# Patient Record
Sex: Female | Born: 1985
Health system: Southern US, Community
[De-identification: ages and names within clinical notes are randomized; demographics above are authoritative.]

## PROBLEM LIST (undated history)

## (undated) DIAGNOSIS — I10 Essential (primary) hypertension: Secondary | ICD-10-CM

## (undated) DIAGNOSIS — I341 Nonrheumatic mitral (valve) prolapse: Secondary | ICD-10-CM

## (undated) DIAGNOSIS — E039 Hypothyroidism, unspecified: Secondary | ICD-10-CM

## (undated) DIAGNOSIS — L680 Hirsutism: Secondary | ICD-10-CM

## (undated) DIAGNOSIS — O139 Gestational [pregnancy-induced] hypertension without significant proteinuria, unspecified trimester: Secondary | ICD-10-CM

## (undated) DIAGNOSIS — I340 Nonrheumatic mitral (valve) insufficiency: Secondary | ICD-10-CM

## (undated) HISTORY — DX: Hirsutism: L68.0

## (undated) HISTORY — DX: Nonrheumatic mitral (valve) prolapse: I34.1

## (undated) HISTORY — DX: Hypothyroidism, unspecified: E03.9

## (undated) HISTORY — DX: Essential (primary) hypertension: I10

## (undated) HISTORY — PX: TOTAL THYROIDECTOMY: SHX2547

## (undated) HISTORY — PX: DILATION AND CURETTAGE OF UTERUS: SHX78

---

## 2007-12-11 ENCOUNTER — Inpatient Hospital Stay (HOSPITAL_COMMUNITY): Admission: AD | Admit: 2007-12-11 | Discharge: 2007-12-12 | Payer: Self-pay | Admitting: Obstetrics & Gynecology

## 2008-01-14 ENCOUNTER — Encounter: Admission: RE | Admit: 2008-01-14 | Discharge: 2008-01-14 | Payer: Self-pay | Admitting: *Deleted

## 2008-02-01 ENCOUNTER — Inpatient Hospital Stay (HOSPITAL_COMMUNITY): Admission: AD | Admit: 2008-02-01 | Discharge: 2008-02-02 | Payer: Self-pay | Admitting: Obstetrics

## 2008-06-19 ENCOUNTER — Inpatient Hospital Stay (HOSPITAL_COMMUNITY): Admission: AD | Admit: 2008-06-19 | Discharge: 2008-06-19 | Payer: Self-pay | Admitting: Obstetrics

## 2008-06-19 ENCOUNTER — Observation Stay (HOSPITAL_COMMUNITY): Admission: AD | Admit: 2008-06-19 | Discharge: 2008-06-19 | Payer: Self-pay | Admitting: *Deleted

## 2008-06-20 ENCOUNTER — Inpatient Hospital Stay (HOSPITAL_COMMUNITY): Admission: AD | Admit: 2008-06-20 | Discharge: 2008-06-23 | Payer: Self-pay | Admitting: *Deleted

## 2008-06-24 ENCOUNTER — Ambulatory Visit (HOSPITAL_COMMUNITY): Admission: RE | Admit: 2008-06-24 | Discharge: 2008-06-24 | Payer: Self-pay | Admitting: Obstetrics and Gynecology

## 2010-12-11 ENCOUNTER — Encounter: Payer: Self-pay | Admitting: *Deleted

## 2011-04-04 NOTE — Op Note (Signed)
NAME:  Samantha Meyer, Samantha Meyer             ACCOUNT NO.:  [id number]   MEDICAL RECORD NO.:  [id number]          PATIENT TYPE:  INP   LOCATION:  9130                          FACILITY:  [facility]   PHYSICIAN:  Lenoard Aden, M.D.DATE OF BIRTH:  [birth year]   DATE OF PROCEDURE:  06/20/2008  DATE OF DISCHARGE:                               OPERATIVE REPORT   PREOPERATIVE DIAGNOSES:  1. 37-week intrauterine pregnancy.  2. Gestational hypertension.  3. Failure to progress.   POSTOPERATIVE DIAGNOSES:  1. 37-week intrauterine pregnancy.  2. Gestational hypertension.  3. Failure to progress.   PROCEDURE:  Primary low transverse C-section.   SURGEON:  Chester Holstein. Earlene Plater, MD.   ASSISTANT:  None.   ANESTHESIA:  Epidural.   FINDINGS:  Viable female, Apgar's 8 and 9, weight pending from the newborn  nursery, and normal-appearing uterus, tubes, and ovaries.   SPECIMENS:  None.   BLOOD LOSS:  600   COMPLICATIONS:  None.   INDICATIONS:  The patient presented at 37+ weeks, prodromal labor, 4-5  cm dilated, contracting regularly, noted to have elevated blood  pressures, and normal preeclampsia labs.  Blood pressures were  substantially elevated over baseline, at times in the severe range.  Therefore, I recommended admission and augmentation with Pitocin if  necessary.   The patient subsequently had the above measures performed, but did not  progressed beyond 5-6 cm for several hours despite documented adequate  contractions by IUPC.  Suspicion for LGA fetus was also in mind;  therefore, I recommended primary cesarean section.  Risks of surgery was  discussed including infection, bleeding, and damage to the surrounding  organs.   DESCRIPTION OF PROCEDURE:  The patient was taken to the operating room  and epidural anesthesia obtained.  Prepped and draped in a standard  fashion.  Foley catheter was already in the bladder.  A low transverse  incision was made on the skin and carried sharply to the  fascia.  The  subcutaneous tissue was then divided bluntly and the fascia divided  bluntly.  Posterior sheath and peritoneum were entered bluntly  superiorly and the bladder blade was inserted.  Uterine incision was  made in low transverse fashion with a knife above the bladder  reflection.  This was extended laterally bluntly and clear fluid noted  at amniotomy.  Vertex elevated through the incision and delivered with  fundal pressure without difficulty.  Nuchal cord x1 reduced without  difficulty.  Shoulders were difficult to deliver due to the fetus' size;  therefore, the anterior shoulder, which was the left was delivered by  flexion at the elbow and traction at the wrist without difficulty.  Remainder of the infant subsequently delivered without difficulty.  Cord  clamped and cut.  Infant handed off to waiting NICU Team.  Ancef 1 g had  been given prior to skin incision.   Placenta was removed by uterine massage.  The uterus was exteriorized  and cleared of all clots and debris.  Uterine incision closed in a  running locked fashion with 0 chromic with hemostasis obtained.  Tubes  and ovaries appeared normal.  Uterus  returned to the abdomen, incision  reinspected, and was hemostatic.  The fascia was closed with a running  stitch of 0 Vicryl and subcutaneous tissue was closed with staples.   The patient tolerated the procedure well with no complications.  She was  taken to recovery room awake and alert in stable condition.  All counts  were correct per the operating room staff.      Lenoard Aden, M.D.  Electronically Signed     RJT/MEDQ  D:  06/20/2008  T:  06/21/2008  Job:  [id number]

## 2011-04-07 NOTE — Discharge Summary (Signed)
NAME:  MYKEL, MOHL             ACCOUNT NO.:  [id number]   MEDICAL RECORD NO.:  [id number]          PATIENT TYPE:  INP   LOCATION:  9130                          FACILITY:  [facility]   PHYSICIAN:  Lenoard Aden, M.D.DATE OF BIRTH:  [birth year]   DATE OF ADMISSION:  06/20/2008  DATE OF DISCHARGE:  06/23/2008                               DISCHARGE SUMMARY   The patient underwent complicated primary C-section for gestational  hypertension, failure to progress.  Postoperative course complicated by  shortness of breath with negative chest x-ray.  She is discharged to  home on hospital day #3.  Discharge teaching done.  Discharge  medications were Tylox, prenatal vitamins, and iron.  Follow up in the  office in 1 week for blood pressure check.  Discharge condition  satisfactory.      Lenoard Aden, M.D.  Electronically Signed     RJT/MEDQ  D:  07/22/2008  T:  07/23/2008  Job:  [id number]

## 2011-04-07 NOTE — Discharge Summary (Signed)
NAME:  Samantha Meyer, Samantha Meyer             ACCOUNT NO.:  [id number]   MEDICAL RECORD NO.:  [id number]          PATIENT TYPE:  INP   LOCATION:  9130                          [facility]ILITY:  [facility]   PHYSICIAN:  Lenoard Aden, M.D.DATE OF BIRTH:  [birth year]   DATE OF ADMISSION:  06/20/2008  DATE OF DISCHARGE:  06/23/2008                               DISCHARGE SUMMARY   Dictated in absence of Dr. Marina Gravel.  Please note that I was not  involved in the care of this patient.   The patient underwent uncomplicated, by report, primary C-section on  June 20, 2008.   POSTOPERATIVE COURSE:  Questionably complicated by an abrupt onset  shortness of breath with decreased oxygen saturations with an otherwise  normal CAT scan and chest x-ray.  The patient improved clinically and  was discharged to home on postop day #3.  Discharge teaching done.  Prenatal vitamins, iron, and Tylox were given.  Follow up in the office  in 1 week      Lenoard Aden, M.D.  Electronically Signed     Lenoard Aden, M.D.  Electronically Signed    RJT/MEDQ  D:  08/12/2008  T:  08/13/2008  Job:  [id number]

## 2011-08-11 LAB — URINALYSIS, ROUTINE W REFLEX MICROSCOPIC
Bilirubin Urine: NEGATIVE
Nitrite: NEGATIVE
Protein, ur: NEGATIVE
Specific Gravity, Urine: <1.005 — ABNORMAL LOW
Urobilinogen, UA: 0.2

## 2011-08-11 LAB — URINE CULTURE

## 2011-08-11 LAB — URINE MICROSCOPIC-ADD ON

## 2011-08-14 LAB — URINALYSIS, ROUTINE W REFLEX MICROSCOPIC
Bilirubin Urine: NEGATIVE
Ketones, ur: NEGATIVE
Nitrite: NEGATIVE
Protein, ur: NEGATIVE
Urobilinogen, UA: 0.2

## 2011-08-18 LAB — COMPREHENSIVE METABOLIC PANEL
ALT: 14
Albumin: 2.4 — ABNORMAL LOW
Alkaline Phosphatase: 122 — ABNORMAL HIGH
Alkaline Phosphatase: 112
BUN: 2 — ABNORMAL LOW
BUN: 2 — ABNORMAL LOW
Calcium: 8.6
Chloride: 107
GFR calc Af Amer: >60
Glucose, Bld: 130 — ABNORMAL HIGH
Glucose, Bld: 113 — ABNORMAL HIGH
Potassium: 3.5
Sodium: 135
Total Bilirubin: 1.1
Total Protein: 5.7 — ABNORMAL LOW

## 2011-08-18 LAB — URIC ACID
Uric Acid, Serum: 5.1
Uric Acid, Serum: 4.7

## 2011-08-18 LAB — CBC
HCT: 32.1 — ABNORMAL LOW
HCT: 32.5 — ABNORMAL LOW
Hemoglobin: 10.8 — ABNORMAL LOW
MCHC: 33.3
MCHC: 34.1
Platelets: 183
Platelets: 171
RBC: 3.12 — ABNORMAL LOW
RDW: 13.2
RDW: 13
RDW: 13.5

## 2011-08-18 LAB — RPR
RPR Ser Ql: NONREACTIVE
RPR Ser Ql: NONREACTIVE

## 2011-08-18 LAB — LACTATE DEHYDROGENASE: LDH: 191

## 2012-01-29 HISTORY — PX: MITRAL VALVE REPAIR: SHX2039

## 2012-02-06 ENCOUNTER — Other Ambulatory Visit: Payer: Self-pay

## 2012-02-06 ENCOUNTER — Encounter (HOSPITAL_COMMUNITY): Payer: Self-pay | Admitting: Emergency Medicine

## 2012-02-06 ENCOUNTER — Emergency Department (HOSPITAL_COMMUNITY): Payer: PRIVATE HEALTH INSURANCE

## 2012-02-06 ENCOUNTER — Emergency Department (HOSPITAL_COMMUNITY)
Admission: EM | Admit: 2012-02-06 | Discharge: 2012-02-06 | Disposition: A | Discharge status: Home / Self Care | Payer: PRIVATE HEALTH INSURANCE | Attending: Emergency Medicine | Admitting: Emergency Medicine

## 2012-02-06 DIAGNOSIS — R000 Tachycardia, unspecified: Secondary | ICD-10-CM

## 2012-02-06 DIAGNOSIS — R110 Nausea: Secondary | ICD-10-CM | POA: Insufficient documentation

## 2012-02-06 DIAGNOSIS — Z954 Presence of other heart-valve replacement: Secondary | ICD-10-CM | POA: Insufficient documentation

## 2012-02-06 DIAGNOSIS — R0989 Other specified symptoms and signs involving the circulatory and respiratory systems: Secondary | ICD-10-CM | POA: Insufficient documentation

## 2012-02-06 DIAGNOSIS — Z952 Presence of prosthetic heart valve: Secondary | ICD-10-CM

## 2012-02-06 DIAGNOSIS — Z7982 Long term (current) use of aspirin: Secondary | ICD-10-CM | POA: Insufficient documentation

## 2012-02-06 DIAGNOSIS — R0609 Other forms of dyspnea: Secondary | ICD-10-CM | POA: Insufficient documentation

## 2012-02-06 DIAGNOSIS — Z79899 Other long term (current) drug therapy: Secondary | ICD-10-CM | POA: Insufficient documentation

## 2012-02-06 DIAGNOSIS — R0602 Shortness of breath: Secondary | ICD-10-CM | POA: Insufficient documentation

## 2012-02-06 DIAGNOSIS — R5381 Other malaise: Secondary | ICD-10-CM | POA: Insufficient documentation

## 2012-02-06 DIAGNOSIS — R079 Chest pain, unspecified: Secondary | ICD-10-CM | POA: Insufficient documentation

## 2012-02-06 HISTORY — DX: Nonrheumatic mitral (valve) insufficiency: I34.0

## 2012-02-06 LAB — BASIC METABOLIC PANEL
CO2: 28 mEq/L (ref 19–32)
Calcium: 9.8 mg/dL (ref 8.4–10.5)
Chloride: 99 mEq/L (ref 96–112)
Creatinine, Ser: 0.91 mg/dL (ref 0.50–1.10)
Glucose, Bld: 104 mg/dL — ABNORMAL HIGH (ref 70–99)

## 2012-02-06 LAB — CBC
Hemoglobin: 11.6 g/dL — ABNORMAL LOW (ref 12.0–15.0)
MCH: 32.9 pg (ref 26.0–34.0)
MCV: 95.8 fL (ref 78.0–100.0)
RBC: 3.53 MIL/uL — ABNORMAL LOW (ref 3.87–5.11)
WBC: 14.1 10*3/uL — ABNORMAL HIGH (ref 4.0–10.5)

## 2012-02-06 LAB — POCT I-STAT TROPONIN I: Troponin i, poc: 0.03 ng/mL (ref 0.00–0.08)

## 2012-02-06 MED ORDER — OXYCODONE-ACETAMINOPHEN 5-325 MG PO TABS
1.0000 | ORAL_TABLET | Freq: Once | ORAL | Status: AC
Start: 2012-02-06 — End: 2012-02-06
  Administered 2012-02-06: 1 via ORAL
  Filled 2012-02-06: qty 1

## 2012-02-06 MED ORDER — IBUPROFEN 600 MG PO TABS: 600.0000 mg | ORAL_TABLET | Freq: Three times a day (TID) | ORAL | Status: DC

## 2012-02-06 MED ORDER — IBUPROFEN 200 MG PO TABS
600.0000 mg | ORAL_TABLET | Freq: Once | ORAL | Status: AC
  Administered 2012-02-06: 600 mg via ORAL
  Filled 2012-02-06: qty 3

## 2012-02-06 MED ORDER — IBUPROFEN 600 MG PO TABS: 600.0000 mg | ORAL_TABLET | Freq: Three times a day (TID) | ORAL | Status: AC

## 2012-02-06 NOTE — ED Notes (Signed)
Pt to xray

## 2012-02-06 NOTE — ED Provider Notes (Signed)
History     CSN: [id number]  Arrival date & time 02/06/12  1744   First MD Initiated Contact with Patient 02/06/12 1949      Chief Complaint  Patient presents with  . Palpitations    (Consider location/radiation/quality/duration/timing/severity/associated sxs/prior treatment) HPI Hx obtained from pt. 26yo black F, who is s/p recent minimally invasive mitral valve replacement performed at [facility] on 01/29/12, presents with rapid HR which started approx 2 hrs prior to arrival. She states that she was released from the hospital on Saturday and had an uneventful hospital course. She has been taking daily weights and vitals as instructed on d/c. Her weight has remained stable at 130lbs. However, she has noted that her BP has been slightly decreased from usual for the past 2 days at around 90s systolic where her normal is around 110 systolic. She noted this evening that she had an elevated HR of around 120. Denies any leg swelling or pain, fever, chills, cough. Has had intermittent chest pain, which is described as stabbing, since her surgery; she was told by her surgeon that this was to be expected and likely due to nerve irritation from the incisions and chest tube placement. Has had dyspnea which has not been changed. Had slight episode of nausea yesterday but none today.  Past Medical History  Diagnosis Date  . Mitral incompetence     Past Surgical History  Procedure Date  . Mitral valve repair 01/29/12    No family history on file.  History  Substance Use Topics  . Smoking status: Never Smoker   . Smokeless tobacco: Not on file  . Alcohol Use: No    OB History    Grav Para Term Preterm Abortions TAB SAB Ect Mult Living                  Review of Systems  Constitutional: Negative for fever, chills, activity change, appetite change and unexpected weight change.  HENT: Negative.   Eyes: Negative.   Respiratory: Positive for shortness of breath. Negative for chest tightness.     Cardiovascular: Positive for palpitations. Negative for chest pain and leg swelling.  Gastrointestinal: Positive for nausea. Negative for vomiting and abdominal pain.  Musculoskeletal: Negative for myalgias.  Skin: Negative for color change.  Neurological: Negative for dizziness, weakness and headaches.    Allergies  Review of patient's allergies indicates no known allergies.  Home Medications   Current Outpatient Rx  Name Route Sig Dispense Refill  . ACETAMINOPHEN 325 MG PO TABS Oral Take 650 mg by mouth every 6 (six) hours as needed. For pain    . AMIODARONE HCL 200 MG PO TABS Oral Take 200 mg by mouth daily.    . ASPIRIN EC 81 MG PO TBEC Oral Take 81 mg by mouth daily.    Marland Kitchen DOCUSATE SODIUM 100 MG PO CAPS Oral Take 100 mg by mouth 2 (two) times daily.    Marland Kitchen FERROUS GLUCONATE 324 (38 FE) MG PO TABS Oral Take 324 mg by mouth 2 (two) times daily.    . FUROSEMIDE 40 MG PO TABS Oral Take 40 mg by mouth daily.    . ADULT MULTIVITAMIN W/MINERALS CH Oral Take 1 tablet by mouth daily.    . OXYCODONE HCL 5 MG PO TABS Oral Take 5 mg by mouth every 6 (six) hours as needed. For pain    . POTASSIUM CHLORIDE CRYS ER 20 MEQ PO TBCR Oral Take 40 mEq by mouth daily.  BP 117/65  Pulse 115  Temp(Src) 98.7 F (37.1 C) (Oral)  Resp 20  SpO2 100%  LMP 02/04/2012  Physical Exam  Nursing note and vitals reviewed. Constitutional: She appears well-developed and well-nourished. No distress.  HENT:  Head: Normocephalic and atraumatic.  Mouth/Throat: Oropharynx is clear and moist.  Eyes: EOM are normal. Pupils are equal, round, and reactive to light.  Neck: Normal range of motion.  Cardiovascular: Regular rhythm and normal heart sounds.  Tachycardia present.   Pulmonary/Chest: Effort normal and breath sounds normal. No respiratory distress. She has no wheezes. She exhibits no tenderness.  Abdominal: Soft. Bowel sounds are normal. There is no tenderness. There is no rebound and no guarding.   Musculoskeletal:       No LE edema noted, moves all 4 ext  Neurological: She is alert.  Skin: Skin is warm and dry. She is not diaphoretic.  Psychiatric: She has a normal mood and affect.    ED Course  Procedures (including critical care time)  Labs Reviewed  CBC - Abnormal; Notable for the following:    WBC 14.1 (*)    RBC 3.53 (*)    Hemoglobin 11.6 (*)    HCT 33.8 (*)    Platelets 468 (*)    All other components within normal limits  BASIC METABOLIC PANEL - Abnormal; Notable for the following:    Glucose, Bld 104 (*)    GFR calc non Af Amer 86 (*)    All other components within normal limits  POCT I-STAT TROPONIN I  PREGNANCY, URINE   Dg Chest 2 View  02/06/2012  *RADIOLOGY REPORT*  Clinical Data: Shortness of breath, palpitations, dyspnea, weakness, post mitral valve repair  CHEST - 2 VIEW  Comparison: None.  Findings: Borderline enlargement cardiac silhouette post MVR. Minimal pulmonary vascular congestion. Mediastinal contours normal. Right pleural effusion with mild bibasilar atelectasis. Upper lungs clear. No acute infiltrate or pneumothorax. Bones unremarkable.  IMPRESSION: Borderline enlargement of cardiac silhouette post MVR. Right pleural effusion with mild bibasilar atelectasis.  Original Report Authenticated By: Lollie Marrow, M.D.   I personally reviewed the plain films.   1. S/P mitral valve replacement   2. Tachycardia       MDM  9:30 PM Discussed pt care with Dr. Ignacia Palma. Labs thus far unremarkable; negative troponin; small R pleural effusion which is likely 2/2 surgery. Tachycardic to ~110-120. Plan to consult [facility] for recs re: need for CTA chest.  10:05 PM Dr. Ignacia Palma discussed with provider on call at [facility]. Did not feel that CTA necessary. Recommended starting the pt on 600mg  Motrin TID in addition to other pain meds, and have the pt call their office directly with further concerns. Return precautions given.  Medical screening  examination/treatment/procedure(s) were conducted as a shared visit with non-physician practitioner(s) and myself.  I personally evaluated the patient during the encounter Pt is 26 year old woman who had recent heart surgery at [facility] for mitral valve replacement.  Exam shows her to have pain on change of position.  Lungs clear, heart sounds normal.  WBC mildly elevated, EKG negative, CXR shows mild R pleural effusion.  Discussed with [facility] cardiovascular surgeon --> diff Dx includes incisional pain, mild pericarditis, doubt PE.  He advised ibuprofen 600 mg tid.  She has an appointment on March 28th at [facility]; she can call in to be seen sooner if she has worsening symptoms. Osvaldo Human, M.D.      Grant Fontana, [place] 02/06/12 2207  Carleene Cooper  III, MD 02/07/12 1259

## 2012-02-06 NOTE — ED Notes (Signed)
Pt c/o rapid heart rate onset approx 2 hrs ago.  Denies any chest pain.  Had mitral valve repain on 3/11 at [facility].  Pt also st's she felt her heart beating irregular

## 2012-02-06 NOTE — ED Provider Notes (Signed)
9:30 PM  Date: 02/06/2012  Rate: 119  Rhythm: sinus tachycardia  QRS Axis: normal  Intervals: PR prolonged  ST/T Wave abnormalities: nonspecific T wave changes  Conduction Disutrbances:none  Narrative Interpretation: Abnormal EKG  Old EKG Reviewed: none available    Carleene Cooper III, MD 02/06/12 2131

## 2012-02-06 NOTE — Discharge Instructions (Signed)
We talked to your providers at [facility] who recommend: 1) start taking the ibuprofen on top of your normal pain medication 2) call them directly at the number listed above if needed for worsening symptoms 3) for worsening condition or other worrisome symptoms, please return to the ED.  Mitral Valve Replacement Care After  Refer to this sheet in the next few weeks. These instructions provide you with information on caring for yourself after your procedure. Your caregiver may also give you specific instructions. Your treatment has been planned according to current medical practices, but problems sometimes occur. Call your caregiver if you have any problems or questions after your procedure.  HOME CARE INSTRUCTIONS   Only take over-the-counter or prescription medicines for pain, fever, or discomfort as directed by your caregiver.   Take your temperature every morning for the first week after surgery. Write these down.   Weigh yourself every morning for at least the first week after surgery and record.   Do not lift more than 10 lb (4.5 kg) until your breastbone (sternum) has healed. Avoid all activities which would place strain on your surgical cut (incision).   You may shower. Do not take baths until instructed by your surgeon. Pat incisions dry. Do not rub incisions with washcloth or towel.   Avoid driving for 4 to 6 weeks after surgery or as instructed.   Use your elastic stockings during the day. You should wear the stockings for at least 2 weeks after discharge or longer if your ankles are swollen. The stockings help blood flow and help reduce swelling in the legs. It is easiest to put the stockings on before you get out of bed in the morning. They should be snug.   Some individuals with a mitral valve replacement need to take antibiotics before having dental work or other surgical procedures. This is called prophylactic antibiotic treatment. These drugs help to prevent infective endocarditis.  Antibiotics are only recommended for individuals with the highest risk for developing infective endocarditis. Let your dentist and your caregiver know if you have a history of any of the following so that the necessary precautions can be taken:   A VSD.   A repaired VSD.   Endocarditis in the past.   An artificial (prosthetic) heart valve.  Pain Control  You may feel some chest, shoulder, or abdominal discomfort. It is caused by the absorption of air through the chest wall.   If a prescription was given for a pain reliever, follow your surgeon's directions.   If the pain is not relieved by your medicine, becomes worse, or you have difficulty breathing, call your surgeon.  Activity  Take frequent rest periods throughout the day.   Wait 1 week before returning to strenuous activities such as heavy lifting (more than 10 pounds), pushing, or pulling.   Talk with your doctor about when you may return to work and your exercise routine.   Do not drive while taking prescription pain medication.  Nutrition  You may resume your normal diet as instructed.   Eat a well-balanced diet.  Elimination Your normal bowel function should return. If constipation should occur, you may:  Take a mild laxative as directed by your caregiver.   Add fruit and bran to your diet.   Drink enough fluids to keep your urine clear or pale yellow.   Call your surgeon if constipation is not relieved.  SEEK IMMEDIATE MEDICAL CARE IF:   You develop chest pain which is not coming from  your incision.   You develop shortness of breath or have difficulty breathing.   You develop a temperature over 102 F (38.9 C).   You have a sudden weight gain. Let your doctor know what the weight gain is.   You develop a rash.   You develop any reaction or side effects to medications given.   You have increased bleeding from wounds.   You see redness, swelling, or have increasing pain in wounds.   You have  yellowish white fluid (pus) coming from your wound.   You feel lightheaded or feel faint.   You feel sick to your stomach (nauseous) or throw up (vomit).  Document Released: 05/26/2005 Document Revised: 10/26/2011 Document Reviewed: 11/06/2005 [facility] Patient Information 2012 [place], [place].Tachycardia, Nonspecific In adults, the heart normally beats between 60 and 100 times a minute. A heart rate over 100 is called tachycardia. When your heart beats too fast, it may not be able to pump enough blood to the rest of the body. CAUSES   Exercise or exertion.   Fever.   Pain or injury.   Infection.   Loss of fluid (dehydration).   Overactive thyroid.   Lack of red blood cells (anemia).   Anxiety.   Alcohol.   Heart arrhythmia.   Caffeine.   Tobacco products.   Diet pills.   Street drugs.   Heart disease.  SYMPTOMS  Palpitations (rapid or irregular heartbeat).   Dizziness.   Tiredness (fatigue).   Shortness of breath.  DIAGNOSIS  After an exam and taking a history, your caregiver may order:  Blood tests.   Electrocardiogram (EKG).   Heart monitor.  TREATMENT  Treatment will depend on the cause and potential for harm. It may include:  Intravenous (IV) replacement of fluids or blood.   Antidote or reversal medicines.   Changes in your present medicines.   Lifestyle changes.  HOME CARE INSTRUCTIONS   Get rest.   Drink enough water and fluids to keep your urine clear or pale yellow.   Avoid:   Caffeine.   Nicotine.   Alcohol.   Stress.   Chocolate.   Stimulants.   Only take medicine as directed by your caregiver.  SEEK IMMEDIATE MEDICAL CARE IF:   You have pain in your chest, upper arms, jaw, or neck.   You become weak, dizzy, or feel faint.   You have palpitations that will not go away.   You throw up (vomit), have diarrhea, or pass blood.   You look pale and your skin is cool and wet.  MAKE SURE YOU:   Understand these  instructions.   Will watch your condition.   Will get help right away if you are not doing well or get worse.  Document Released: 12/14/2004 Document Revised: 10/26/2011 Document Reviewed: 10/17/2011 [facility] Patient Information 2012 [place], [place].

## 2012-02-06 NOTE — ED Notes (Signed)
Rx x 1, pt voiced understanding to f/u with MD at [facility] for worsening of condition.

## 2012-02-06 NOTE — ED Notes (Signed)
Pt from x-ray in w/c

## 2012-02-06 NOTE — ED Notes (Signed)
[facility] contacted, PA on call for Dr. Silvestre Mesi , Rochele Pages will be returning call to Dr. Ignacia Palma.

## 2015-05-26 DIAGNOSIS — I471 Supraventricular tachycardia, unspecified: Secondary | ICD-10-CM | POA: Insufficient documentation

## 2015-05-26 DIAGNOSIS — R55 Syncope and collapse: Secondary | ICD-10-CM | POA: Insufficient documentation

## 2015-05-26 DIAGNOSIS — I059 Rheumatic mitral valve disease, unspecified: Secondary | ICD-10-CM | POA: Insufficient documentation

## 2017-09-05 ENCOUNTER — Encounter: Payer: Self-pay | Admitting: Internal Medicine

## 2017-11-09 ENCOUNTER — Encounter: Payer: Self-pay | Admitting: Internal Medicine

## 2017-11-09 ENCOUNTER — Ambulatory Visit: Payer: BC Managed Care – PPO | Admitting: Internal Medicine

## 2017-11-09 VITALS — BP 130/76 | HR 75 | Ht 67.5 in | Wt 175.2 lb

## 2017-11-09 DIAGNOSIS — E890 Postprocedural hypothyroidism: Secondary | ICD-10-CM | POA: Diagnosis not present

## 2017-11-09 LAB — T4, FREE: Free T4: 0.49 ng/dL — ABNORMAL LOW (ref 0.60–1.60)

## 2017-11-09 LAB — TSH: TSH: 33.11 u[IU]/mL — ABNORMAL HIGH (ref 0.35–4.50)

## 2017-11-09 NOTE — Progress Notes (Signed)
Patient ID: Samantha RevereCandice E Meyer, female   DOB: 06-12-1986, 31 y.o.   MRN: [id number]    HPI  Samantha Meyer is a 31 y.o.-year-old female, referred by Dr. Billy Coastaavon for management of uncontrolled postsurgical hypothyroidism.  Pt. has a history of MVP (dx at 31 y/o) >> repair surgery in 11/2011. She continued to have anxiety, hot flushes, palpitations, dizziness, weight loss, after the sx. 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   She tried oral medications for hyperthyroidism for 2-3 years >> but developed SVT: tachycardia + syncope >> as she had a large goiter then, she was advised to have total thyroidectomy >> had this in 2016.  After her total thyroidectomy >> started on Levothyroxine but she was noncompliant and her endocrinologist left practice >> TFTs fluctuating but mostly very high (see below) >> Dr. Billy Coastaavon referred her to me.   She was on Levothyroxine 137 mcg - stopped taking it in 08/2017 after the last set of TFTs! She was taking this: - fasting, 1h after waking up in am - with water - separated by <30 min from b'fast  - no calcium, PPIs - + multivitamins in am - no iron  I reviewed pt's thyroid tests: 08/31/2017: TSH 19.8, Free T3 2.4 (2-4.4): Free T4 1.41 (0.82-1.77) 06/05/2017: TSH 75 No results found for: TSH, FREET4   Pt describes: - + weight gain - ~50 lbs after thyroid sx. - + fatigue - no cold intolerance - + depression - no constipation - + dry skin - + hair loss - + Lighter menses, but monthly.  Pt denies feeling nodules in neck, hoarseness, dysphagia/odynophagia, SOB with lying down.  She has + FH of thyroid disorders in: father - hypothyroidism. No FH of thyroid cancer.  No h/o radiation tx to head or neck. No recent use of iodine supplements.  ROS: Constitutional: + see HPI Eyes: no blurry vision, no xerophthalmia ENT: no sore throat, + see HPI Cardiovascular: + CP (less frequent than before) /SOB/palpitations/leg swelling Respiratory: no  cough/SOB Gastrointestinal: no N/V/D/C Musculoskeletal: no muscle/joint aches Skin: no rashes Neurological: no tremors/numbness/tingling/dizziness Psychiatric: no depression/anxiety  Past Medical History:  Diagnosis Date  . Mitral incompetence    Past Surgical History:  Procedure Laterality Date  . MITRAL VALVE REPAIR  01/29/12   Social History   Socioeconomic History  . Marital status: Married    Spouse name: Not on file  . Number of children: 1 boy - 989 y/o in 2018  Social Needs  Occupational History  . counselor  Tobacco Use  . Smoking status: Never Smoker  . Smokeless tobacco: Never Used  Substance and Sexual Activity  . Alcohol use: No  . Drug use: No  . Sexual activity: Yes    Birth control/protection: IUD   Current Outpatient Medications on File Prior to Visit  Medication Sig Dispense Refill  . Multiple Vitamin (MULITIVITAMIN WITH MINERALS) TABS Take 1 tablet by mouth daily.    .       No current facility-administered medications on file prior to visit.    No Known Allergies   FHL: see HPI  PE: BP 130/76   Pulse 75   Ht 5' 7.5" (1.715 m)   Wt 175 lb 3.2 oz (79.5 kg)   LMP 11/04/2017   SpO2 98%   BMI 27.04 kg/m  Wt Readings from Last 3 Encounters:  11/09/17 175 lb 3.2 oz (79.5 kg)   Constitutional: overweight, in NAD Eyes: PERRLA, EOMI, no exophthalmos ENT: moist mucous membranes,  no neck masses, thyroidectomy scar healed, w/o cheloid, no cervical lymphadenopathy Cardiovascular: RRR, No MRG Respiratory: CTA B Gastrointestinal: abdomen soft, NT, ND, BS+ Musculoskeletal: no deformities, strength intact in all 4 Skin: moist, warm, no rashes Neurological: no tremor with outstretched hands, DTR normal in all 4  ASSESSMENT: 1.  Postsurgical hypothyroidism  PLAN:  1. Patient with long-standing, uncontrolled, hypothyroidism, not compliant with on levothyroxine therapy.Reviewed prev. TSH levels >> she had a TSH of 75 this summer (was off LT4 then,  also). She then started to take the med but skipping doses and taking it right before b'fast >> TSH improved to 19. After this, as she was still tired and still gaining weight >> she stopped completely. We discussed at this visit about potential short and long term consequences of uncontrolled hypothyroidism, to include sudden cardiac death, CHF and dementia. She is now determined to gain control of her thyroid condition. - for now, will restart LT4 137 mcg daily - We discussed about correct intake of levothyroxine, fasting, with water, separated by at least 30 minutes from breakfast, and separated by more than 4 hours from calcium, iron, multivitamins, acid reflux medications (PPIs). - will check thyroid tests today: TSH, free T4 - we will also repeat labs in 5 weeks after restarting LT4  - she has scheduled wisdom teeth extraction on 11/29/2017 >> we discussed that she is a high risk for anesthesia if she has uncontrolled hypothyroidism >> she will try to postpone the Sx - I will see her back in 3 months  Office Visit on 11/09/2017  Component Date Value Ref Range Status  . TSH 11/09/2017 33.11* 0.35 - 4.50 uIU/mL Final  . Free T4 11/09/2017 0.49* 0.60 - 1.60 ng/dL Final   Comment: Specimens from patients who are undergoing biotin therapy and /or ingesting biotin supplements may contain high levels of biotin.  The higher biotin concentration in these specimens interferes with this Free T4 assay.  Specimens that contain high levels  of biotin may cause false high results for this Free T4 assay.  Please interpret results in light of the total clinical presentation of the patient.     Will continue with the plan to restart her LT4 137 mcg daily.  Carlus Pavlovristina Isela Stantz, MD PhD [facility]

## 2017-11-09 NOTE — Patient Instructions (Signed)
Please stop at the lab.  Please restart levothyroxine 137 mcg daily.  Take the thyroid hormone every day, with water, at least 30 minutes before breakfast, separated by at least 4 hours from: - acid reflux medications - calcium - iron - multivitamins  Come back for labs in 5 weeks.  Please come back for a follow-up appointment in 3 months.   Hypothyroidism Hypothyroidism is a disorder of the thyroid. The thyroid is a large gland that is located in the lower front of the neck. The thyroid releases hormones that control how the body works. With hypothyroidism, the thyroid does not make enough of these hormones. What are the causes? Causes of hypothyroidism may include:  Viral infections.  Pregnancy.  Your own defense system (immune system) attacking your thyroid.  Certain medicines.  Birth defects.  Past radiation treatments to your head or neck.  Past treatment with radioactive iodine.  Past surgical removal of part or all of your thyroid.  Problems with the gland that is located in the center of your brain (pituitary).  What are the signs or symptoms? Signs and symptoms of hypothyroidism may include:  Feeling as though you have no energy (lethargy).  Inability to tolerate cold.  Weight gain that is not explained by a change in diet or exercise habits.  Dry skin.  Coarse hair.  Menstrual irregularity.  Slowing of thought processes.  Constipation.  Sadness or depression.  How is this diagnosed? Your health care provider may diagnose hypothyroidism with blood tests and ultrasound tests. How is this treated? Hypothyroidism is treated with medicine that replaces the hormones that your body does not make. After you begin treatment, it may take several weeks for symptoms to go away. Follow these instructions at home:  Take medicines only as directed by your health care provider.  If you start taking any new medicines, tell your health care provider.  Keep  all follow-up visits as directed by your health care provider. This is important. As your condition improves, your dosage needs may change. You will need to have blood tests regularly so that your health care provider can watch your condition. Contact a health care provider if:  Your symptoms do not get better with treatment.  You are taking thyroid replacement medicine and: ? You sweat excessively. ? You have tremors. ? You feel anxious. ? You lose weight rapidly. ? You cannot tolerate heat. ? You have emotional swings. ? You have diarrhea. ? You feel weak. Get help right away if:  You develop chest pain.  You develop an irregular heartbeat.  You develop a rapid heartbeat. This information is not intended to replace advice given to you by your health care provider. Make sure you discuss any questions you have with your health care provider. Document Released: 11/06/2005 Document Revised: 04/13/2016 Document Reviewed: 03/24/2014 Elsevier Interactive Patient Education  2018 [employer]

## 2017-11-12 ENCOUNTER — Telehealth: Payer: Self-pay

## 2017-11-12 NOTE — Telephone Encounter (Signed)
VM full unable to LVM

## 2017-11-12 NOTE — Telephone Encounter (Signed)
-----   Message from Carlus Pavlovristina Gherghe, MD sent at 11/09/2017  5:28 PM EST ----- Toni Amendourtney, can you please call pt: Please give her the TSH result (3) >> continue with the plan to restart her LT4 and come back for labs in 5 weeks, as discussed.

## 2017-12-04 ENCOUNTER — Telehealth: Payer: Self-pay | Admitting: Internal Medicine

## 2017-12-04 MED ORDER — LEVOTHYROXINE SODIUM 112 MCG PO TABS: 112.0000 ug | ORAL_TABLET | Freq: Every day | ORAL | 0 refills | Status: DC

## 2017-12-04 NOTE — Telephone Encounter (Signed)
Pt has lab visit on 12/17/17 should she cancel that appointment and move it out 5 more weeks?

## 2017-12-04 NOTE — Telephone Encounter (Signed)
Please advise on below and if it can be connected to her new dosage

## 2017-12-04 NOTE — Telephone Encounter (Signed)
Please try to send a lower dose of levothyroxine for her, 112.  We need another set of TFTs at least 5 weeks after this change in dose.

## 2017-12-04 NOTE — Telephone Encounter (Signed)
Yes, lets do that!

## 2017-12-04 NOTE — Telephone Encounter (Signed)
Pt called about having headaches 3 weeks feeling dizzy and for 1 week. Pt noticed a difference since taking medication of  levothyroxine (SYNTHROID, LEVOTHROID) 137 MCG tablet. Please advise? Thank you!  Call pt @ [phone].

## 2017-12-05 NOTE — Telephone Encounter (Signed)
Pt aware and appt moved

## 2017-12-12 NOTE — Telephone Encounter (Signed)
Sue LushAndrea from Dr Billy Coastaavon office need last office notes for this patient  Attention Sue Lushndrea Fax# [phone]

## 2017-12-14 NOTE — Telephone Encounter (Signed)
Last office visit notes from 11/09/2017 faxed to Dr. Jorene Minorsaavon's office.

## 2017-12-17 ENCOUNTER — Other Ambulatory Visit: Payer: PRIVATE HEALTH INSURANCE

## 2018-01-18 ENCOUNTER — Other Ambulatory Visit: Payer: PRIVATE HEALTH INSURANCE

## 2018-01-18 DIAGNOSIS — E890 Postprocedural hypothyroidism: Secondary | ICD-10-CM

## 2018-01-18 LAB — T4, FREE: FREE T4: 2 ng/dL — AB (ref 0.8–1.8)

## 2018-01-18 LAB — TSH: TSH: 0.02 m[IU]/L — AB

## 2018-01-18 NOTE — Addendum Note (Signed)
Addended by: STONE-ELMORE, Valinda Fedie I on: 01/18/2018 04:35 PM   Modules accepted: Orders  

## 2018-01-18 NOTE — Addendum Note (Signed)
Addended by: Adline MangoSTONE-ELMORE, Hava Massingale I on: 01/18/2018 04:35 PM   Modules accepted: Orders

## 2018-01-18 NOTE — Addendum Note (Signed)
Addended by: STONE-ELMORE, Joniyah Mallinger I on: 01/18/2018 04:35 PM   Modules accepted: Orders  

## 2018-01-21 ENCOUNTER — Telehealth: Payer: Self-pay

## 2018-01-21 DIAGNOSIS — E890 Postprocedural hypothyroidism: Secondary | ICD-10-CM

## 2018-01-21 MED ORDER — LEVOTHYROXINE SODIUM 100 MCG PO TABS: 100.0000 ug | ORAL_TABLET | Freq: Every day | ORAL | 3 refills | Status: DC

## 2018-01-21 NOTE — Telephone Encounter (Signed)
-----   Message from Carlus Pavlovristina Gherghe, MD sent at 01/21/2018  9:29 AM EST ----- Toni Amendourtney, can you please call pt: The levothyroxine dose is still slightly high, at 112 mcg daily.  Can you please call in 100 mcg levothyroxine and have her back for another set of labs: TSH, free T4 (can you please order?)  In 1.5 months.  Please take off the 112 mcg dose from her medication list.

## 2018-01-22 ENCOUNTER — Telehealth: Payer: Self-pay | Admitting: Internal Medicine

## 2018-01-23 NOTE — Telephone Encounter (Signed)
Please advise 

## 2018-01-23 NOTE — Telephone Encounter (Signed)
Patient called and wanted to know if she can have her labs redone since there was a big difference in her labs. Please give her a call back to discuss with her.

## 2018-01-24 NOTE — Telephone Encounter (Signed)
Called patient and scheduled lab appt

## 2018-01-24 NOTE — Telephone Encounter (Signed)
OK 

## 2018-01-24 NOTE — Telephone Encounter (Signed)
C, please give her a call and see what she means.  We have an appointment later this month and we can repeat her labs then if absolutely needed, otherwise, I usually like to wait at least 5 weeks between labs.

## 2018-01-24 NOTE — Telephone Encounter (Addendum)
Spoke to patient. She says she does not trust that her lab values were right b/c of the drastic change. Does not want to wait at least 5 weeks between labs, after I explained Dr. Charlean SanfilippoGherghe's previous note and the benefits of doing so. Patient insist labs ordered to be done asap.

## 2018-01-29 ENCOUNTER — Other Ambulatory Visit (INDEPENDENT_AMBULATORY_CARE_PROVIDER_SITE_OTHER): Payer: BC Managed Care – PPO

## 2018-01-29 DIAGNOSIS — E890 Postprocedural hypothyroidism: Secondary | ICD-10-CM

## 2018-01-29 LAB — TSH: TSH: 0.02 u[IU]/mL — ABNORMAL LOW (ref 0.35–4.50)

## 2018-01-30 ENCOUNTER — Telehealth: Payer: Self-pay

## 2018-01-30 ENCOUNTER — Telehealth: Payer: Self-pay | Admitting: Internal Medicine

## 2018-01-30 LAB — T4, FREE: FREE T4: 1.24 ng/dL (ref 0.60–1.60)

## 2018-01-30 NOTE — Telephone Encounter (Signed)
Spoke to patient. Gave lab results and instructions. Patient verbalized understanding and says she will discuss repeating labs in OV on 03/22.

## 2018-01-30 NOTE — Telephone Encounter (Signed)
Patient returned 2 missed calls from our office re: lab results. Please call pt at ph# [phone]. If no answer leave detailed message on above ph#

## 2018-01-30 NOTE — Telephone Encounter (Signed)
-----   Message from Carlus Pavlovristina Gherghe, MD sent at 01/30/2018 12:28 PM EDT ----- Tad Mooreasandra, can you please call pt: Samantha Meyer TFTs were drawn too soon after the previous (only 12 days when it should have been at least 5 weeks).  Can you please reorder a TSH and free T4 and have Samantha Meyer come back in another 4 weeks?

## 2018-01-30 NOTE — Telephone Encounter (Signed)
Called patient. No answer. Will try later.  

## 2018-02-08 ENCOUNTER — Ambulatory Visit (INDEPENDENT_AMBULATORY_CARE_PROVIDER_SITE_OTHER): Payer: BC Managed Care – PPO | Admitting: Internal Medicine

## 2018-02-08 ENCOUNTER — Encounter: Payer: Self-pay | Admitting: Internal Medicine

## 2018-02-08 VITALS — BP 126/82 | HR 90 | Ht 67.5 in | Wt 170.0 lb

## 2018-02-08 DIAGNOSIS — E890 Postprocedural hypothyroidism: Secondary | ICD-10-CM

## 2018-02-08 NOTE — Progress Notes (Signed)
Patient ID: Samantha Meyer, female   DOB: [birth year], 32 y.o.   MRN: [id number]    HPI  Samantha Meyer is a 32 y.o.-year-old female, initially referred by Dr. Billy Coast, returning for follow-up for t of uncontrolled postsurgical hypothyroidism.  Last visit 3 months ago.  Reviewed history: Pt. has a history of MVP (dx at 32 y/o) >> repair surgery in 11/2011. She continued to have anxiety, hot flushes, palpitations, dizziness, weight loss, after the sx. 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   She tried oral medications for hyperthyroidism for 2-3 years >> but developed SVT: tachycardia + syncope >> as she had a large goiter then, she was advised to have total thyroidectomy >> had this in 2016.  After her total thyroidectomy >> started on Levothyroxine but she was noncompliant and her endocrinologist left practice >> TFTs fluctuating but mostly very high (see below) >> Dr. Billy Coast referred her to me.   At last visit, we had a long discussion about possible consequences of uncontrolled hypothyroidism and I recommended that she restarted her levothyroxine (at that time, she was off the medication completely).  We restarted it at 137 mcg daily but we had to decrease the dose to 100 mcg daily after she started to take it consistently.  Pt is on levothyroxine 100 mcg daily, taken: - in am; at 6-6:30 am - fasting - at least 30 min from b'fast - no Ca, Fe, PPIs - + MVI  - at night - not on Biotin  I reviewed patient's TFTs: Lab Results  Component Value Date   TSH 0.02 (L) 01/29/2018   TSH 0.02 (L) 01/18/2018   TSH 33.11 (H) 11/09/2017   FREET4 1.24 01/29/2018   FREET4 2.0 (H) 01/18/2018   FREET4 0.49 (L) 11/09/2017  08/31/2017: TSH 19.8, Free T3 2.4 (2-4.4): Free T4 1.41 (0.82-1.77) 06/05/2017: TSH 75  Pt denies: - feeling nodules in neck - hoarseness - dysphagia - choking - SOB with lying down  She has + FH of thyroid disorders in: father - hypothyroidism. No FH of thyroid cancer.  No h/o radiation tx to head or neck.  No seaweed or kelp. No recent contrast studies. No herbal supplements. No Biotin use. No recent steroids use.   She stopped drinking sugary drinks, fast foods, fried foods.  ROS: Constitutional: + intentional o weight loss, + fatigue, no subjective hyperthermia, no subjective hypothermia Eyes: no blurry vision, no xerophthalmia ENT: no sore throat, + see HPI Cardiovascular: no CP/no SOB/+ occas. palpitations/no leg swelling Respiratory: no cough/no SOB/no wheezing Gastrointestinal: no N/no V/no D/no C/no acid reflux Musculoskeletal: no muscle aches/no joint aches Skin: no rashes, no hair loss Neurological: no tremors/no numbness/no tingling/no dizziness  I reviewed pt's medications, allergies, PMH, social hx, family hx, and changes were documented in the history of present illness. Otherwise, unchanged from my initial visit note.  Past Medical History:  Diagnosis Date  . Mitral incompetence    Past Surgical History:  Procedure Laterality Date  . MITRAL VALVE REPAIR  01/29/12   Social History   Socioeconomic History  . Marital status: Married    Spouse name: Not on file  . Number of children: 1 boy - 27 y/o in 2018  Social Needs  Occupational History  . counselor  Tobacco Use  . Smoking status: Never Smoker  . Smokeless tobacco: Never Used  Substance and Sexual Activity  . Alcohol use: No  . Drug use: No  . Sexual activity: Yes    Birth  control/protection: IUD   Current Outpatient Medications  Medication Sig Dispense Refill  . levothyroxine (SYNTHROID, LEVOTHROID) 100 MCG tablet Take 1 tablet (100 mcg total) by mouth daily. 90 tablet 3  . Multiple Vitamin (MULITIVITAMIN WITH MINERALS) TABS Take 1 tablet by mouth daily.     No current facility-administered medications for this visit.     No Known Allergies   FHL: see HPI  PE: BP 126/82   Pulse 90   Ht 5' 7.5" (1.715 m)   Wt 170 lb (77.1 kg)   SpO2 99%   BMI 26.23 kg/m   Wt Readings from Last 3 Encounters:  02/08/18 170 lb (77.1 kg)  11/09/17 175 lb 3.2 oz (79.5 kg)   Constitutional: overweight, in NAD Eyes: PERRLA, EOMI, no exophthalmos ENT: moist mucous membranes, no neck masses, thyroidectomy scar healed, no cervical lymphadenopathy Cardiovascular: RRR, No MRG Respiratory: CTA B Gastrointestinal: abdomen soft, NT, ND, BS+ Musculoskeletal: no deformities, strength intact in all 4 Skin: moist, warm, no rashes Neurological: no tremor with outstretched hands, DTR normal in all 4  ASSESSMENT: 1.  Postsurgical hypothyroidism  PLAN:  1. Patient with long-standing, uncontrolled, hypothyroidism, previously noncompliant with her levothyroxine therapy.  In summer 2018 she had a TSH of 75 (off levothyroxine).  She then started to take the medication but skipped doses and was taking it right before breakfast so her TSH improved to 19.  As she became more tired, she stopped taking it completely afterwards.  At previous visits we discussed about potential short-term and long-term consequences of uncontrolled hypothyroidism to include sudden death, CHF, dementia.  At last visit I advised her to start taking levothyroxine 137 mcg consistently. - She did start to take this-as prescribed but she developed HAs >> decreased the dose to 112 mcg daily. The next TSH was suppressed so we ended up decreasing her levothyroxine dose to 100 mcg daily 4 weeks ago. - latest thyroid labs reviewed with pt >> TSH was suppressed, after which we decreased the dose - pt feels good on this current dose - we discussed about taking the thyroid hormone every day, with water, >30 minutes before breakfast, separated by >4 hours from acid reflux medications, calcium, iron, multivitamins. Pt. is taking it correctly. - will check thyroid tests in 2 more  weeks: TSH and fT4 - RTC in 6 mo  - time spent with the patient: 15 min, of which >50% was spent in obtaining information about her symptoms,  reviewing her previous labs, evaluations, and treatments, counseling her about her condition (please see the discussed topics above), and developing a plan to further investigate and treat it; she had a number of questions which I addressed.   Carlus Pavlovristina Mackinley Kiehn, MD PhD [facility]

## 2018-02-08 NOTE — Patient Instructions (Signed)
Please come back for labs in 2 weeks.  Continue Levothyroxine 100 mcg daily.  Take the thyroid hormone every day, with water, at least 30 minutes before breakfast, separated by at least 4 hours from: - acid reflux medications - calcium - iron - multivitamins  Please come back for a follow-up appointment in 6 months.

## 2018-02-22 ENCOUNTER — Other Ambulatory Visit: Payer: BC Managed Care – PPO

## 2018-04-11 ENCOUNTER — Other Ambulatory Visit: Payer: BC Managed Care – PPO

## 2018-04-11 ENCOUNTER — Other Ambulatory Visit (INDEPENDENT_AMBULATORY_CARE_PROVIDER_SITE_OTHER): Payer: BC Managed Care – PPO

## 2018-04-11 DIAGNOSIS — E890 Postprocedural hypothyroidism: Secondary | ICD-10-CM

## 2018-04-11 LAB — TSH: TSH: 0.14 u[IU]/mL — ABNORMAL LOW (ref 0.35–4.50)

## 2018-04-11 LAB — T4, FREE: Free T4: 1.22 ng/dL (ref 0.60–1.60)

## 2018-04-12 ENCOUNTER — Telehealth: Payer: Self-pay

## 2018-04-12 DIAGNOSIS — E890 Postprocedural hypothyroidism: Secondary | ICD-10-CM

## 2018-04-12 MED ORDER — LEVOTHYROXINE SODIUM 88 MCG PO TABS: 88.0000 ug | ORAL_TABLET | Freq: Every day | ORAL | 1 refills | Status: DC

## 2018-04-12 NOTE — Telephone Encounter (Signed)
-----   Message from Carlus Pavlov, MD sent at 04/11/2018  5:34 PM EDT ----- Tad Moore, can you please call pt: Thyroid tests are better, but not quite normal.  Please decrease the Synthroid dose to 88 mcg daily.  Can you please call this dose in to her pharmacy and take off her medication list the 100 mcg tablet?  She will then need to have a TSH and a free T4 checked in 2 months.  Can you please order this?

## 2018-04-12 NOTE — Telephone Encounter (Signed)
Spoke to patient. Gave results.  Patient verbalized understanding.  Sent Rx Ordered labs Pt will c/b to schedule lab appt.

## 2018-06-14 ENCOUNTER — Other Ambulatory Visit (INDEPENDENT_AMBULATORY_CARE_PROVIDER_SITE_OTHER): Payer: BC Managed Care – PPO

## 2018-06-14 DIAGNOSIS — E890 Postprocedural hypothyroidism: Secondary | ICD-10-CM

## 2018-06-14 LAB — T4, FREE: Free T4: 0.96 ng/dL (ref 0.60–1.60)

## 2018-06-14 LAB — TSH: TSH: 2.49 u[IU]/mL (ref 0.35–4.50)

## 2018-08-12 ENCOUNTER — Encounter: Payer: Self-pay | Admitting: Internal Medicine

## 2018-08-12 ENCOUNTER — Ambulatory Visit: Payer: BC Managed Care – PPO | Admitting: Internal Medicine

## 2018-08-12 VITALS — BP 120/78 | HR 88 | Ht 67.5 in | Wt 169.0 lb

## 2018-08-12 DIAGNOSIS — Z23 Encounter for immunization: Secondary | ICD-10-CM

## 2018-08-12 DIAGNOSIS — E890 Postprocedural hypothyroidism: Secondary | ICD-10-CM

## 2018-08-12 NOTE — Progress Notes (Signed)
Patient ID: Samantha Meyer, female   DOB: 1985-12-19, 32 y.o.   MRN: [id number]    HPI  Samantha Meyer is a 32 y.o.-year-old female, initially referred by Dr. Billy Coast, returning for follow-up for uncontrolled postsurgical hypothyroidism.  Last visit 6 months ago.  She is feeling much better after we started to reduce her levothyroxine dose and her TSH has normalized.  She is not as exhausted as before and her anxiety has resolved.  Reviewed and addended history: Pt. has a history of MVP (dx at 32 y/o) >> repair surgery in 11/2011. She continued to have anxiety, hot flushes, palpitations, dizziness, weight loss, after the sx. 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   She tried oral medications for hyperthyroidism for 2-3 years >> but developed SVT: tachycardia + syncope >> as she had a large goiter then, she was advised to have total thyroidectomy >> had this in 2016.  After her total thyroidectomy >> started on Levothyroxine but she was noncompliant and her endocrinologist left practice >> TFTs fluctuating but mostly very high (see below) >> Dr. Billy Coast referred her to me.   After I started to see her, we discussed at length about possible consequences of uncontrolled hypothyroidism and I recommended that she restarted her levothyroxine (at that time, she was off the medication completely).  We restarted it at 137 mcg daily but we had to decrease the dose to 100 mcg and then to 88 mcg daily after she started to take it consistently.  Pt is on levothyroxine 88 mcg daily, taken: - in am (6 to 6:30 AM) - fasting - at least 30 min from b'fast - no Ca, Fe, PPIs - + MVI at night - not on Biotin  Reviewed patient's TFTs: Lab Results  Component Value Date   TSH 2.49 06/14/2018   TSH 0.14 (L) 04/11/2018   TSH 0.02 (L) 01/29/2018   TSH 0.02 (L) 01/18/2018   TSH 33.11 (H) 11/09/2017   FREET4 0.96 06/14/2018   FREET4 1.22 04/11/2018   FREET4 1.24 01/29/2018   FREET4 2.0 (H) 01/18/2018    FREET4 0.49 (L) 11/09/2017  08/31/2017: TSH 19.8, Free T3 2.4 (2-4.4): Free T4 1.41 (0.82-1.77) 06/05/2017: TSH 75  Pt denies: - feeling nodules in neck - hoarseness - dysphagia - choking - SOB with lying down  She has + FH of thyroid disorders in: father - hypothyroidism. No FH of thyroid cancer. No h/o radiation tx to head or neck.  No herbal supplements. No Biotin use but would like to start a Hair Skin and Nails vitamin. No recent steroids use.   She stopped drinking sugary drinks, fast foods, fried foods before last visit.  ROS: Constitutional: no weight gain/no weight loss, + fatigue, no subjective hyperthermia, + subjective hypothermia Eyes: no blurry vision, no xerophthalmia ENT: no sore throat, + see HPI Cardiovascular: + CP (followed by cardiology, whom she will see soon)/no SOB/no palpitations/no leg swelling Respiratory: no cough/no SOB/no wheezing Gastrointestinal: no N/no V/no D/no C/no acid reflux Musculoskeletal: no muscle aches/+ joint aches (elbow) Skin: no rashes, no hair loss, + increased hair on chin (plaques) Neurological: no tremors/no numbness/no tingling/no dizziness  I reviewed pt's medications, allergies, PMH, social hx, family hx, and changes were documented in the history of present illness. Otherwise, unchanged from my initial visit note.  Past Medical History:  Diagnosis Date  . Mitral incompetence    Past Surgical History:  Procedure Laterality Date  . MITRAL VALVE REPAIR  01/29/12   Social  History   Socioeconomic History  . Marital status: Married    Spouse name: Not on file  . Number of children: 1 boy - 33 y/o in 2018  Social Needs  Occupational History  . counselor  Tobacco Use  . Smoking status: Never Smoker  . Smokeless tobacco: Never Used  Substance and Sexual Activity  . Alcohol use: No  . Drug use: No  . Sexual activity: Yes    Birth control/protection: IUD   Current Outpatient Medications  Medication Sig Dispense Refill   . levothyroxine (SYNTHROID, LEVOTHROID) 88 MCG tablet Take 1 tablet (88 mcg total) by mouth daily. 90 tablet 1  . Multiple Vitamin (MULITIVITAMIN WITH MINERALS) TABS Take 1 tablet by mouth daily.     No current facility-administered medications for this visit.     No Known Allergies   FHL: see HPI  PE: BP 120/78   Pulse 88   Ht 5' 7.5" (1.715 m) Comment: measured  Wt 169 lb (76.7 kg)   SpO2 97%   BMI 26.08 kg/m  Wt Readings from Last 3 Encounters:  08/12/18 169 lb (76.7 kg)  02/08/18 170 lb (77.1 kg)  11/09/17 175 lb 3.2 oz (79.5 kg)   Constitutional: overweight, in NAD Eyes: PERRLA, EOMI, no exophthalmos ENT: moist mucous membranes, + thyroidectomy scar healed, no cervical lymphadenopathy Cardiovascular: RRR, No MRG Respiratory: CTA B Gastrointestinal: abdomen soft, NT, ND, BS+ Musculoskeletal: no deformities, strength intact in all 4 Skin: moist, warm, no rashes Neurological: no tremor with outstretched hands, DTR normal in all 4  ASSESSMENT: 1.  Postsurgical hypothyroidism  PLAN:  1. Patient with long-standing, uncontrolled, hypothyroidism, previously noncompliant with her levothyroxine therapy.  In summer 2018 she had a TSH of 75 (she was off levothyroxine then).  She then restarted to take the medication but still skip doses and was taking it right before breakfast, so her TSH improved to only 19.  We discussed at today's visit and also at previous visits about the importance of taking the medication consistently, daily, and we also discussed about potential short-term and long-term consequences of uncontrolled hypothyroidism.  These include sudden death, CHF, dementia.  Patient started to take the levothyroxine consistently (at that time she was taking 137 mcg), but developed headaches.  We had to decrease the dose to 112 mcg daily.  The TSH obtained next was still suppressed, so we ended up decreasing the dose to 100 mcg daily at last visit and then to 88 mcg daily in  03/2018.  On this dose, finally, her TFTs returned normal in 05/2018.  She tells me that she feels much better now, especially since her labs normalized.  Her  exhaustion and anxiety have resolved - latest thyroid labs reviewed with pt >> normal  - she continues on LT4 88 mcg daily - we discussed about taking the thyroid hormone every day, with water, >30 minutes before breakfast, separated by >4 hours from acid reflux medications, calcium, iron, multivitamins. Pt. is taking it correctly.  We discussed that if she starts her skin and nails vitamins that she plans, she will need to be off biotin for at least a week prior to next set of labs - will check thyroid tests today: TSH and fT4 - If labs are abnormal, she will need to return for repeat TFTs in 1.5 months - Otherwise, I will see her back in a year.  - time spent with the patient: 15 min, of which >50% was spent in obtaining information about her  symptoms, reviewing her previous labs, evaluations, and treatments, counseling her about her condition (please see the discussed topics above), and developing a plan to further investigate and treat it; she had a number of questions which I addressed.  Office Visit on 08/12/2018  Component Date Value Ref Range Status  . TSH 08/12/2018 1.90  0.35 - 4.50 uIU/mL Final  . Free T4 08/12/2018 1.05  0.60 - 1.60 ng/dL Final   Comment: Specimens from patients who are undergoing biotin therapy and /or ingesting biotin supplements may contain high levels of biotin.  The higher biotin concentration in these specimens interferes with this Free T4 assay.  Specimens that contain high levels  of biotin may cause false high results for this Free T4 assay.  Please interpret results in light of the total clinical presentation of the patient.     Thyroid tests are excellent!    Carlus Pavlov, MD PhD [facility]

## 2018-08-12 NOTE — Patient Instructions (Addendum)
Continue Levothyroxine 88 mcg daily.  Take the thyroid hormone every day, with water, at least 30 minutes before breakfast, separated by at least 4 hours from: - acid reflux medications - calcium - iron - multivitamins  Please stop at the lab.  Please come back for a follow-up appointment in 1 year.

## 2018-08-13 ENCOUNTER — Telehealth: Payer: Self-pay

## 2018-08-13 LAB — TSH: TSH: 1.9 u[IU]/mL (ref 0.35–4.50)

## 2018-08-13 LAB — T4, FREE: Free T4: 1.05 ng/dL (ref 0.60–1.60)

## 2018-08-13 NOTE — Telephone Encounter (Signed)
-----   Message from Carlus Pavlovristina Gherghe, MD sent at 08/13/2018  1:54 PM EDT ----- Efraim KaufmannMelissa, can you please call pt: Thyroid tests are excellent!  No need to start antithyroid medication.

## 2018-08-13 NOTE — Telephone Encounter (Signed)
Notes recorded by Carlus PavlovGherghe, Cristina, MD on 08/13/2018 at 1:56 PM EDT M, Please ignore my previous message. Please let her know that her thyroid tests are excellent. Continue the same dose of levothyroxine. ------  Notified patient of message from Dr. Elvera LennoxGherghe, patient expressed understanding and agreement. No further questions.

## 2018-10-19 ENCOUNTER — Other Ambulatory Visit: Payer: Self-pay | Admitting: Internal Medicine

## 2019-04-16 DIAGNOSIS — Z9889 Other specified postprocedural states: Secondary | ICD-10-CM | POA: Insufficient documentation

## 2019-04-16 DIAGNOSIS — Z8679 Personal history of other diseases of the circulatory system: Secondary | ICD-10-CM | POA: Insufficient documentation

## 2019-05-08 ENCOUNTER — Other Ambulatory Visit: Payer: Self-pay | Admitting: Internal Medicine

## 2019-07-09 ENCOUNTER — Telehealth: Payer: Self-pay | Admitting: Internal Medicine

## 2019-07-09 NOTE — Telephone Encounter (Signed)
Have not received anything

## 2019-07-09 NOTE — Telephone Encounter (Signed)
Patient has called in regards to a medical clearance her oral surgeon would have sent over to be signed.  Dr. Buelah Manis - oral surgeon  Please Advise, Thanks

## 2019-07-09 NOTE — Telephone Encounter (Signed)
Spoke with patient and let her know we have not gotten the fax, she will have them fax it again.

## 2019-07-10 ENCOUNTER — Other Ambulatory Visit: Payer: Self-pay | Admitting: Internal Medicine

## 2019-07-10 ENCOUNTER — Telehealth: Payer: Self-pay

## 2019-07-10 DIAGNOSIS — E890 Postprocedural hypothyroidism: Secondary | ICD-10-CM

## 2019-07-10 NOTE — Telephone Encounter (Signed)
Per Dr. Cruzita Lederer patient needs thyroid labs prior to filling out the dental clearance.  Labs are ordered and patient is scheduled for tomorrow morning, patient notified and agrees.

## 2019-07-11 ENCOUNTER — Other Ambulatory Visit (INDEPENDENT_AMBULATORY_CARE_PROVIDER_SITE_OTHER): Payer: BC Managed Care – PPO

## 2019-07-11 ENCOUNTER — Other Ambulatory Visit: Payer: Self-pay

## 2019-07-11 DIAGNOSIS — E890 Postprocedural hypothyroidism: Secondary | ICD-10-CM | POA: Diagnosis not present

## 2019-07-11 LAB — T4, FREE: Free T4: 1.33 ng/dL (ref 0.60–1.60)

## 2019-07-11 LAB — TSH: TSH: 2.69 u[IU]/mL (ref 0.35–4.50)

## 2019-07-14 ENCOUNTER — Telehealth: Payer: Self-pay

## 2019-07-14 NOTE — Telephone Encounter (Signed)
-----   Message from Philemon Kingdom, MD sent at 07/11/2019  4:56 PM EDT ----- Lenna Sciara, can you please call pt: TFTs are normal.  I signed the surgical clearance form and put it on your desk.

## 2019-07-15 NOTE — Telephone Encounter (Signed)
Notified patient of message from Dr. Cruzita Lederer, patient expressed understanding and agreement. No further questions.  Form faxed.

## 2019-07-18 ENCOUNTER — Other Ambulatory Visit: Payer: Self-pay

## 2019-07-22 ENCOUNTER — Other Ambulatory Visit: Payer: Self-pay

## 2019-07-22 ENCOUNTER — Encounter: Payer: Self-pay | Admitting: Internal Medicine

## 2019-07-22 ENCOUNTER — Ambulatory Visit (INDEPENDENT_AMBULATORY_CARE_PROVIDER_SITE_OTHER): Payer: BC Managed Care – PPO | Admitting: Internal Medicine

## 2019-07-22 VITALS — BP 120/88 | HR 97 | Ht 67.5 in | Wt 161.0 lb

## 2019-07-22 DIAGNOSIS — E890 Postprocedural hypothyroidism: Secondary | ICD-10-CM

## 2019-07-22 NOTE — Progress Notes (Signed)
Patient ID: Samantha Meyer, female   DOB: 16-Aug-1986, 33 y.o.   MRN: [id number]    HPI  Samantha Meyer is a 33 y.o.-year-old female, initially referred by Dr. Billy Coastaavon, returning for follow-up for postsurgical hypothyroidism.  Last visit 11 months ago.  After the last levothyroxine dose change, she felt much better, with less exhaustion and without anxiety.  She will have oral sx soon.  Reviewed history: Pt. has a history of MVP (dx at 33 y/o) >> repair surgery in 11/2011. She continued to have anxiety, hot flushes, palpitations, dizziness, weight loss, after the sx. 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   She tried oral medications for hyperthyroidism for 2-3 years >> but developed SVT: tachycardia + syncope >> as she had a large goiter then, she was advised to have total thyroidectomy >> had this in 2016.  After her total thyroidectomy >> started on Levothyroxine but she was noncompliant and her endocrinologist left practice >> TFTs fluctuating but mostly very high (see below) >> Dr. Billy Coastaavon referred her to me.   After I started to see her, we discussed at length about possible consequences of uncontrolled hypothyroidism and I recommended that she restarted her levothyroxine (at that time, she was off the medication completely).  We restarted it at 137 mcg daily but we had to decrease the dose to 100 mcg and then to 88 mcg daily after she started to take it consistently.  Pt is on levothyroxine 88 mcg daily, taken: - in am - fasting - at least 30 min from b'fast - no Ca, Fe, PPIs - + Multivitamins at night - not on Biotin  Reviewed patient's TFTs: Lab Results  Component Value Date   TSH 2.69 07/11/2019   TSH 1.90 08/12/2018   TSH 2.49 06/14/2018   TSH 0.14 (L) 04/11/2018   TSH 0.02 (L) 01/29/2018   TSH 0.02 (L) 01/18/2018   TSH 33.11 (H) 11/09/2017   FREET4 1.33 07/11/2019   FREET4 1.05 08/12/2018   FREET4 0.96 06/14/2018   FREET4 1.22 04/11/2018   FREET4 1.24  01/29/2018   FREET4 2.0 (H) 01/18/2018   FREET4 0.49 (L) 11/09/2017  08/31/2017: TSH 19.8, Free T3 2.4 (2-4.4): Free T4 1.41 (0.82-1.77) 06/05/2017: TSH 75  Pt denies: - feeling nodules in neck - hoarseness - dysphagia - choking - SOB with lying down  She has + FH of thyroid disorders in: father - hypothyroidism. No FH of thyroid cancer. No h/o radiation tx to head or neck.  No herbal supplements. No Biotin use. No recent steroids use.   She stopped drinking sugary drinks, fast foods, fried foods.  She is seeing cardiology for chest pain.   She just started OCPs for prolonged and very painful menses.  ROS: + see HPI Constitutional: no weight gain/+ weight loss, no fatigue, no subjective hyperthermia, no subjective hypothermia Eyes: no blurry vision, no xerophthalmia ENT: no sore throat, + see HPI Cardiovascular: + CP/no SOB/no palpitations/no leg swelling Respiratory: no cough/no SOB/no wheezing Gastrointestinal: no N/no V/no D/no C/no acid reflux Musculoskeletal: no muscle aches/no joint aches Skin: no rashes, no hair loss, + darker hair on sideburns and 1 patch on chin Neurological: no tremors/no numbness/no tingling/no dizziness  I reviewed pt's medications, allergies, PMH, social hx, family hx, and changes were documented in the history of present illness. Otherwise, unchanged from my initial visit note.  Past Medical History:  Diagnosis Date  . Mitral incompetence    Past Surgical History:  Procedure Laterality Date  .  MITRAL VALVE REPAIR  01/29/12   Social History   Socioeconomic History  . Marital status: Married    Spouse name: Not on file  . Number of children: 1 boy - 57 y/o in 2018  Social Needs  Occupational History  . counselor  Tobacco Use  . Smoking status: Never Smoker  . Smokeless tobacco: Never Used  Substance and Sexual Activity  . Alcohol use: No  . Drug use: No  . Sexual activity: Yes    Birth control/protection: IUD   Current Outpatient  Medications  Medication Sig Dispense Refill  . levothyroxine (SYNTHROID) 88 MCG tablet TAKE 1 TABLET BY MOUTH EVERY DAY 90 tablet 1  . Multiple Vitamin (MULITIVITAMIN WITH MINERALS) TABS Take 1 tablet by mouth daily.     No current facility-administered medications for this visit.     No Known Allergies   FHL: see HPI  PE: BP 120/88   Pulse 97   Ht 5' 7.5" (1.715 m)   Wt 161 lb (73 kg)   SpO2 98%   BMI 24.84 kg/m  Wt Readings from Last 3 Encounters:  07/22/19 161 lb (73 kg)  08/12/18 169 lb (76.7 kg)  02/08/18 170 lb (77.1 kg)   Constitutional: overweight, in NAD Eyes: PERRLA, EOMI, no exophthalmos ENT: moist mucous membranes, thyroidectomy scar healed, no cervical lymphadenopathy Cardiovascular: tachycardia, RR, No MRG Respiratory: CTA B Gastrointestinal: abdomen soft, NT, ND, BS+ Musculoskeletal: no deformities, strength intact in all 4 Skin: moist, warm, no rashes Neurological: no tremor with outstretched hands, DTR normal in all 4  ASSESSMENT: 1.  Postsurgical hypothyroidism  PLAN:  1. Patient with longstanding, uncontrolled, hypothyroidism previously noncompliant with her levothyroxine therapy.  In summer 2018 she had a TSH of 75 (she was off her levothyroxine then).  She then restarted to take the thyroid medication but skipped doses and was taking in try before breakfast so her TSH only improved to 19.  After I started to see her, we discussed about how to take the levothyroxine correctly and consistently.  When she started to take it consistently, she developed headaches.  We had to decrease the dose further with the last decreased to 88 mcg daily 03/2018.  She denies headaches on this dose. Finally, her TFTs returned normal in 05/2018.  She starting to feel much better especially after her labs normalized.  Her exhaustion and anxiety have resolved. - latest thyroid labs reviewed with pt >> normal last month - she continues on LT4 88 mcg daily - pt feels good on this  dose and was able to lose weight.  - we discussed about taking the thyroid hormone every day, with water, >30 minutes before breakfast, separated by >4 hours from acid reflux medications, calcium, iron, multivitamins. Pt. is taking it correctly. - will check thyroid tests in ~ 6 months - I will see her back in 1 year  - time spent with the patient: 15 min, of which >50% was spent in obtaining information about her symptoms, reviewing her previous labs, evaluations, and treatments, counseling her about her condition (please see the discussed topics above), and developing a plan to further investigate and treat it; she had a number of questions which I addressed.  Philemon Kingdom, MD PhD [facility]

## 2019-07-22 NOTE — Patient Instructions (Signed)
Please come back for labs in ~6 months. Continue Levothyroxine 88 mcg daily.  Take the thyroid hormone every day, with water, at least 30 minutes before breakfast, separated by at least 4 hours from: - acid reflux medications - calcium - iron - multivitamins  Please come back for a follow-up appointment in 1 year.

## 2019-08-14 ENCOUNTER — Ambulatory Visit: Payer: BC Managed Care – PPO | Admitting: Internal Medicine

## 2019-11-06 ENCOUNTER — Other Ambulatory Visit: Payer: Self-pay | Admitting: Internal Medicine

## 2020-01-15 ENCOUNTER — Other Ambulatory Visit: Payer: Self-pay | Admitting: Internal Medicine

## 2020-01-15 DIAGNOSIS — E890 Postprocedural hypothyroidism: Secondary | ICD-10-CM

## 2020-01-19 ENCOUNTER — Other Ambulatory Visit: Payer: BC Managed Care – PPO

## 2020-01-21 ENCOUNTER — Other Ambulatory Visit (INDEPENDENT_AMBULATORY_CARE_PROVIDER_SITE_OTHER): Payer: Self-pay

## 2020-01-21 ENCOUNTER — Other Ambulatory Visit: Payer: Self-pay

## 2020-01-21 DIAGNOSIS — E890 Postprocedural hypothyroidism: Secondary | ICD-10-CM

## 2020-01-21 LAB — T4, FREE: Free T4: 1.1 ng/dL (ref 0.60–1.60)

## 2020-01-21 LAB — TSH: TSH: 8.24 u[IU]/mL — ABNORMAL HIGH (ref 0.35–4.50)

## 2020-01-23 ENCOUNTER — Telehealth: Payer: Self-pay

## 2020-01-23 DIAGNOSIS — E890 Postprocedural hypothyroidism: Secondary | ICD-10-CM

## 2020-01-23 NOTE — Telephone Encounter (Signed)
Notified patient of message, she has not missed any doses but does admit she has not been taking it in the mornings, sometimes she takes during middle of day and at night.  Please advise.  Labs ordered.

## 2020-01-23 NOTE — Telephone Encounter (Signed)
Notified patient of message from Dr. Gherghe, patient expressed understanding and agreement. No further questions.  

## 2020-01-23 NOTE — Telephone Encounter (Signed)
I see.  That is probably the reason why changed.  She needs to move it first thing in the morning.  Asked her to place it on the nightstand.

## 2020-01-23 NOTE — Telephone Encounter (Signed)
-----   Message from Carlus Pavlov, MD sent at 01/21/2020  5:29 PM EST ----- Efraim Kaufmann, can you please call pt: TSH is now higher. In August, it was normal, on the same LT4 dose... Did she change how she was taking the LT4? Did she miss any doses? I would suggest to repeat the labs in 1.5 months, and continue the same dose for now. Can you please order a TSH and fT4?

## 2020-04-16 DIAGNOSIS — Z3689 Encounter for other specified antenatal screening: Secondary | ICD-10-CM | POA: Diagnosis not present

## 2020-04-16 DIAGNOSIS — Z3200 Encounter for pregnancy test, result unknown: Secondary | ICD-10-CM | POA: Diagnosis not present

## 2020-04-20 DIAGNOSIS — Z3201 Encounter for pregnancy test, result positive: Secondary | ICD-10-CM | POA: Diagnosis not present

## 2020-04-26 ENCOUNTER — Telehealth: Payer: Self-pay | Admitting: Internal Medicine

## 2020-04-26 NOTE — Telephone Encounter (Signed)
Patient called to advise that she is pregnant.  Was advised by OB/GYN to call and let Dr Elvera Lennox know due to Thyroid

## 2020-04-26 NOTE — Telephone Encounter (Signed)
Melissa, can you please call pt:  CONGRATULATIONS!  We will need to check her thyroid at the soonest possible and then increase the dose of levothyroxine.  Labs are in, can she drop by the lab at her earliest convenience?

## 2020-04-28 ENCOUNTER — Encounter: Payer: Self-pay | Admitting: Internal Medicine

## 2020-04-28 ENCOUNTER — Other Ambulatory Visit (INDEPENDENT_AMBULATORY_CARE_PROVIDER_SITE_OTHER): Payer: BLUE CROSS/BLUE SHIELD

## 2020-04-28 ENCOUNTER — Other Ambulatory Visit: Payer: Self-pay | Admitting: Internal Medicine

## 2020-04-28 ENCOUNTER — Other Ambulatory Visit: Payer: Self-pay

## 2020-04-28 DIAGNOSIS — E890 Postprocedural hypothyroidism: Secondary | ICD-10-CM | POA: Diagnosis not present

## 2020-04-28 LAB — TSH: TSH: 10.69 u[IU]/mL — ABNORMAL HIGH (ref 0.35–4.50)

## 2020-04-28 LAB — T4, FREE: Free T4: 0.7 ng/dL (ref 0.60–1.60)

## 2020-04-28 MED ORDER — LEVOTHYROXINE SODIUM 137 MCG PO TABS: 137.0000 ug | ORAL_TABLET | Freq: Every day | ORAL | 5 refills | Status: DC

## 2020-04-28 NOTE — Telephone Encounter (Signed)
Notified patient of message from Dr. Elvera Lennox, patient expressed understanding and agreement. No further questions.  Patient coming to the lab today.

## 2020-04-29 ENCOUNTER — Encounter: Payer: Self-pay | Admitting: Internal Medicine

## 2020-04-30 ENCOUNTER — Other Ambulatory Visit: Payer: Self-pay | Admitting: Internal Medicine

## 2020-05-03 MED ORDER — LEVOTHYROXINE SODIUM 137 MCG PO TABS: 137.0000 ug | ORAL_TABLET | Freq: Every day | ORAL | 5 refills | Status: DC

## 2020-05-06 DIAGNOSIS — Z3A10 10 weeks gestation of pregnancy: Secondary | ICD-10-CM | POA: Diagnosis not present

## 2020-05-06 DIAGNOSIS — O09521 Supervision of elderly multigravida, first trimester: Secondary | ICD-10-CM | POA: Diagnosis not present

## 2020-05-06 DIAGNOSIS — Z36 Encounter for antenatal screening for chromosomal anomalies: Secondary | ICD-10-CM | POA: Diagnosis not present

## 2020-05-06 DIAGNOSIS — Z3689 Encounter for other specified antenatal screening: Secondary | ICD-10-CM | POA: Diagnosis not present

## 2020-05-06 LAB — OB RESULTS CONSOLE HIV ANTIBODY (ROUTINE TESTING): HIV: NONREACTIVE

## 2020-05-06 LAB — OB RESULTS CONSOLE RPR: RPR: NONREACTIVE

## 2020-05-06 LAB — OB RESULTS CONSOLE RUBELLA ANTIBODY, IGM: Rubella: IMMUNE

## 2020-05-06 LAB — OB RESULTS CONSOLE HEPATITIS B SURFACE ANTIGEN: Hepatitis B Surface Ag: NEGATIVE

## 2020-05-18 DIAGNOSIS — Z3A12 12 weeks gestation of pregnancy: Secondary | ICD-10-CM | POA: Diagnosis not present

## 2020-05-18 DIAGNOSIS — E039 Hypothyroidism, unspecified: Secondary | ICD-10-CM | POA: Diagnosis not present

## 2020-05-18 DIAGNOSIS — Z8679 Personal history of other diseases of the circulatory system: Secondary | ICD-10-CM | POA: Diagnosis not present

## 2020-05-18 DIAGNOSIS — Z9889 Other specified postprocedural states: Secondary | ICD-10-CM | POA: Diagnosis not present

## 2020-05-20 DIAGNOSIS — Z118 Encounter for screening for other infectious and parasitic diseases: Secondary | ICD-10-CM | POA: Diagnosis not present

## 2020-05-20 DIAGNOSIS — O09521 Supervision of elderly multigravida, first trimester: Secondary | ICD-10-CM | POA: Diagnosis not present

## 2020-05-20 DIAGNOSIS — Z3A12 12 weeks gestation of pregnancy: Secondary | ICD-10-CM | POA: Diagnosis not present

## 2020-05-20 DIAGNOSIS — Z114 Encounter for screening for human immunodeficiency virus [HIV]: Secondary | ICD-10-CM | POA: Diagnosis not present

## 2020-05-20 DIAGNOSIS — O10011 Pre-existing essential hypertension complicating pregnancy, first trimester: Secondary | ICD-10-CM | POA: Diagnosis not present

## 2020-05-20 DIAGNOSIS — O99281 Endocrine, nutritional and metabolic diseases complicating pregnancy, first trimester: Secondary | ICD-10-CM | POA: Diagnosis not present

## 2020-05-26 ENCOUNTER — Other Ambulatory Visit: Payer: Self-pay

## 2020-05-26 ENCOUNTER — Other Ambulatory Visit (INDEPENDENT_AMBULATORY_CARE_PROVIDER_SITE_OTHER): Payer: BLUE CROSS/BLUE SHIELD

## 2020-05-26 DIAGNOSIS — E890 Postprocedural hypothyroidism: Secondary | ICD-10-CM

## 2020-05-26 LAB — TSH: TSH: 1.25 u[IU]/mL (ref 0.35–4.50)

## 2020-05-26 LAB — T4, FREE: Free T4: 1.1 ng/dL (ref 0.60–1.60)

## 2020-06-02 DIAGNOSIS — Z9889 Other specified postprocedural states: Secondary | ICD-10-CM | POA: Diagnosis not present

## 2020-06-16 ENCOUNTER — Encounter: Payer: Self-pay | Admitting: Internal Medicine

## 2020-06-16 DIAGNOSIS — E039 Hypothyroidism, unspecified: Secondary | ICD-10-CM | POA: Diagnosis not present

## 2020-06-16 DIAGNOSIS — L600 Ingrowing nail: Secondary | ICD-10-CM | POA: Diagnosis not present

## 2020-06-16 DIAGNOSIS — L03031 Cellulitis of right toe: Secondary | ICD-10-CM | POA: Diagnosis not present

## 2020-06-16 DIAGNOSIS — Z9889 Other specified postprocedural states: Secondary | ICD-10-CM | POA: Diagnosis not present

## 2020-06-17 DIAGNOSIS — O09522 Supervision of elderly multigravida, second trimester: Secondary | ICD-10-CM | POA: Diagnosis not present

## 2020-06-17 DIAGNOSIS — Z361 Encounter for antenatal screening for raised alphafetoprotein level: Secondary | ICD-10-CM | POA: Diagnosis not present

## 2020-06-17 DIAGNOSIS — O10012 Pre-existing essential hypertension complicating pregnancy, second trimester: Secondary | ICD-10-CM | POA: Diagnosis not present

## 2020-06-17 DIAGNOSIS — Z3A16 16 weeks gestation of pregnancy: Secondary | ICD-10-CM | POA: Diagnosis not present

## 2020-06-17 DIAGNOSIS — O99282 Endocrine, nutritional and metabolic diseases complicating pregnancy, second trimester: Secondary | ICD-10-CM | POA: Diagnosis not present

## 2020-07-09 DIAGNOSIS — O09522 Supervision of elderly multigravida, second trimester: Secondary | ICD-10-CM | POA: Diagnosis not present

## 2020-07-09 DIAGNOSIS — O99282 Endocrine, nutritional and metabolic diseases complicating pregnancy, second trimester: Secondary | ICD-10-CM | POA: Diagnosis not present

## 2020-07-09 DIAGNOSIS — O10012 Pre-existing essential hypertension complicating pregnancy, second trimester: Secondary | ICD-10-CM | POA: Diagnosis not present

## 2020-07-09 DIAGNOSIS — Z3A19 19 weeks gestation of pregnancy: Secondary | ICD-10-CM | POA: Diagnosis not present

## 2020-07-22 ENCOUNTER — Encounter: Payer: Self-pay | Admitting: Internal Medicine

## 2020-07-22 ENCOUNTER — Ambulatory Visit: Payer: BLUE CROSS/BLUE SHIELD | Admitting: Internal Medicine

## 2020-07-22 ENCOUNTER — Other Ambulatory Visit: Payer: Self-pay

## 2020-07-22 VITALS — BP 128/80 | HR 104 | Ht 65.5 in | Wt 170.0 lb

## 2020-07-22 DIAGNOSIS — E890 Postprocedural hypothyroidism: Secondary | ICD-10-CM

## 2020-07-22 NOTE — Progress Notes (Signed)
Patient ID: Samantha Meyer, female   DOB: [birth year], 34 y.o.   MRN: [id number]   This visit occurred during the SARS-CoV-2 public health emergency.  Safety protocols were in place, including screening questions prior to the visit, additional usage of staff PPE, and extensive cleaning of exam room while observing appropriate contact time as indicated for disinfecting solutions.   HPI  Samantha Meyer is a 34 y.o.-year-old female, initially referred by Dr. Billy Coast, returning for follow-up for postsurgical hypothyroidism.  Last visit a year ago.  She is now pregnant [redacted] weeks along.  She will give birth in January.    She has some nausea but only 1 vomiting episode.  Reviewed history: Pt. has a history of MVP (dx at 34 y/o) >> repair surgery in 11/2011. She continued to have anxiety, hot flushes, palpitations, dizziness, weight loss, after the sx. 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   She tried oral medications for hyperthyroidism for 2-3 years >> but developed SVT: tachycardia + syncope >> as she had a large goiter then, she was advised to have total thyroidectomy >> had this in 2016.  After her total thyroidectomy >> started on Levothyroxine but she was noncompliant and her endocrinologist left practice >> TFTs fluctuating but mostly very high (see below) >> Dr. Billy Coast referred her to me.   After I started to see her, we discussed at length about possible consequences of uncontrolled hypothyroidism and I recommended that she restarted her levothyroxine (at that time, she was off the medication completely).  We restarted it at 137 mcg daily but we had to decrease the dose to 100 mcg and then to 88 mcg daily after she started to take it consistently.  She found out she was pregnant in 04/2020.  We started to increase the dose of levothyroxine.  She moved it at night and her TSH became uncontrolled again.  I again advised her to move it in the morning.  Pt is on levothyroxine 137 mcg  daily, taken: - in am - fasting - at least 30-60 min from b'fast - no Ca, Fe, PPIs, + multivitamins at night - not on Biotin - on ASA 81 in the evening  Reviewed patient's TFTs: Lab Results  Component Value Date   TSH 1.25 05/26/2020   TSH 10.69 (H) 04/28/2020   TSH 8.24 (H) 01/21/2020   TSH 2.69 07/11/2019   TSH 1.90 08/12/2018   TSH 2.49 06/14/2018   TSH 0.14 (L) 04/11/2018   TSH 0.02 (L) 01/29/2018   TSH 0.02 (L) 01/18/2018   TSH 33.11 (H) 11/09/2017   FREET4 1.10 05/26/2020   FREET4 0.70 04/28/2020   FREET4 1.10 01/21/2020   FREET4 1.33 07/11/2019   FREET4 1.05 08/12/2018   FREET4 0.96 06/14/2018   FREET4 1.22 04/11/2018   FREET4 1.24 01/29/2018   FREET4 2.0 (H) 01/18/2018   FREET4 0.49 (L) 11/09/2017  08/31/2017: TSH 19.8, Free T3 2.4 (2-4.4): Free T4 1.41 (0.82-1.77) 06/05/2017: TSH 75  Pt denies: - feeling nodules in neck - hoarseness - dysphagia - choking - SOB with lying down  She describes intentionally losing approximately 20 pounds before she found out she was pregnant.  She has + FH of thyroid disorders in: father - hypothyroidism. No FH of thyroid cancer. No h/o radiation tx to head or neck.  No seaweed or kelp. No recent contrast studies. No herbal supplements. No Biotin use. No recent steroids use. She wast hpiseeing cardiology for chest pain.   ROS:  Constitutional: +  weight gain/no weight loss, no fatigue, no subjective hyperthermia, no subjective hypothermia Eyes: no blurry vision, no xerophthalmia ENT: no sore throat, + see HPI Cardiovascular: no CP/no SOB/no palpitations/no leg swelling Respiratory: no cough/no SOB/no wheezing Gastrointestinal: + N/no V/no D/no C/no acid reflux Musculoskeletal: no muscle aches/no joint aches Skin: no rashes, no hair loss Neurological: no tremors/no numbness/no tingling/no dizziness  I reviewed pt's medications, allergies, PMH, social hx, family hx, and changes were documented in the history of present  illness. Otherwise, unchanged from my initial visit note.  Past Medical History:  Diagnosis Date  . Mitral incompetence    Past Surgical History:  Procedure Laterality Date  . MITRAL VALVE REPAIR  01/29/12   Social History   Socioeconomic History  . Marital status: Married    Spouse name: Not on file  . Number of children: 1 boy - 22 y/o in 2018  Social Needs  Occupational History  . counselor  Tobacco Use  . Smoking status: Never Smoker  . Smokeless tobacco: Never Used  Substance and Sexual Activity  . Alcohol use: No  . Drug use: No  . Sexual activity: Yes    Birth control/protection: IUD   Current Outpatient Medications  Medication Sig Dispense Refill  . EUTHYROX 88 MCG tablet Take 1 tablet by mouth once daily 90 tablet 0  . levothyroxine (SYNTHROID) 137 MCG tablet Take 1 tablet (137 mcg total) by mouth daily. 30 tablet 5  . Multiple Vitamin (MULITIVITAMIN WITH MINERALS) TABS Take 1 tablet by mouth daily.     No current facility-administered medications for this visit.    No Known Allergies   FHL: see HPI  PE: BP 128/80   Pulse (!) 104   Ht 5' 5.5" (1.664 m)   Wt 170 lb (77.1 kg)   SpO2 98%   BMI 27.86 kg/m  Wt Readings from Last 3 Encounters:  07/22/20 170 lb (77.1 kg)  07/22/19 161 lb (73 kg)  08/12/18 169 lb (76.7 kg)   Constitutional: overweight, in NAD Eyes: PERRLA, EOMI, no exophthalmos ENT: moist mucous membranes, no neck masses palpated, thyroidectomy scar healed, no cervical lymphadenopathy Cardiovascular: tachycardia, RR, No MRG Respiratory: CTA B Gastrointestinal: abdomen soft, NT, ND, BS+ Musculoskeletal: no deformities, strength intact in all 4 Skin: moist, warm, no rashes Neurological: no tremor with outstretched hands, DTR normal in all 4  ASSESSMENT: 1.  Postsurgical hypothyroidism - second trimester of pregnancy  PLAN:  1. Patient with longstanding, uncontrolled, hypothyroidism, previously noncompliant with her levothyroxine  therapy. In summer 2018 she had a TSH of 75 (she was off her levothyroxine then).  She then restarted to take the thyroid medication but skipped doses and was taking it too close to breakfast so her TSH only improved to 19.  When she started to take it consistently afterwards, he developed headaches.  We had to decrease the dose to 88 mcg daily.  On this dose, her tests are finally normal in 05/2018.  She also did not have headaches and her fatigue was much improved. - she sent me a message that she became pregnant in 04/2020.  At that time, we increase the dose of levothyroxine gradually.  Unfortunately, she also moved it at night so her TSH levels were not controlled recently.  I advised her to move it in the morning and we increased the dose to 137 mcg daily. - latest thyroid labs reviewed with pt >> normal on the current dose of levothyroxine: Lab Results  Component Value Date  TSH 1.25 05/26/2020   - she continues on LT4 137 mcg daily - pt feels good on this dose, but on today's exam she has significant tachycardia.  We discussed that this may be a sign of too much levothyroxine. - we discussed about taking the thyroid hormone every day, with water, >30 minutes before breakfast, separated by >4 hours from acid reflux medications, calcium, iron, multivitamins. Pt. is taking it correctly now. - will check thyroid tests today: TSH and fT4.  If the labs are normal, we can recheck them after she gives birth. - If labs are abnormal, she will need to return for repeat TFTs in 1.5 months - I will see her back in 4 months  Component     Latest Ref Rng & Units 07/22/2020  TSH     0.35 - 4.50 uIU/mL 0.62  T4,Free(Direct)     0.60 - 1.60 ng/dL 2.77   Excellent TFTs.  We can wait until after she gives birth to repeat them.  Carlus Pavlov, MD PhD [facility]

## 2020-07-22 NOTE — Patient Instructions (Signed)
Please stop at the lab.  Continue Levothyroxine 137 mcg daily.  Take the thyroid hormone every day, with water, at least 30 minutes before breakfast, separated by at least 4 hours from: - acid reflux medications - calcium - iron - multivitamins  Please come back for a follow-up appointment in February.

## 2020-07-23 LAB — TSH: TSH: 0.62 u[IU]/mL (ref 0.35–4.50)

## 2020-07-23 LAB — T4, FREE: Free T4: 0.93 ng/dL (ref 0.60–1.60)

## 2020-08-06 DIAGNOSIS — Z3A23 23 weeks gestation of pregnancy: Secondary | ICD-10-CM | POA: Diagnosis not present

## 2020-08-06 DIAGNOSIS — O99282 Endocrine, nutritional and metabolic diseases complicating pregnancy, second trimester: Secondary | ICD-10-CM | POA: Diagnosis not present

## 2020-08-06 DIAGNOSIS — O09522 Supervision of elderly multigravida, second trimester: Secondary | ICD-10-CM | POA: Diagnosis not present

## 2020-08-06 DIAGNOSIS — O10012 Pre-existing essential hypertension complicating pregnancy, second trimester: Secondary | ICD-10-CM | POA: Diagnosis not present

## 2020-08-20 DIAGNOSIS — Z8679 Personal history of other diseases of the circulatory system: Secondary | ICD-10-CM | POA: Diagnosis not present

## 2020-08-20 DIAGNOSIS — Z8279 Family history of other congenital malformations, deformations and chromosomal abnormalities: Secondary | ICD-10-CM | POA: Diagnosis not present

## 2020-08-24 DIAGNOSIS — Z20828 Contact with and (suspected) exposure to other viral communicable diseases: Secondary | ICD-10-CM | POA: Diagnosis not present

## 2020-09-03 DIAGNOSIS — O99282 Endocrine, nutritional and metabolic diseases complicating pregnancy, second trimester: Secondary | ICD-10-CM | POA: Diagnosis not present

## 2020-09-03 DIAGNOSIS — Z3A27 27 weeks gestation of pregnancy: Secondary | ICD-10-CM | POA: Diagnosis not present

## 2020-09-03 DIAGNOSIS — O09522 Supervision of elderly multigravida, second trimester: Secondary | ICD-10-CM | POA: Diagnosis not present

## 2020-09-03 DIAGNOSIS — O10012 Pre-existing essential hypertension complicating pregnancy, second trimester: Secondary | ICD-10-CM | POA: Diagnosis not present

## 2020-09-07 ENCOUNTER — Other Ambulatory Visit: Payer: Self-pay | Admitting: Obstetrics and Gynecology

## 2020-09-07 DIAGNOSIS — Z9889 Other specified postprocedural states: Secondary | ICD-10-CM

## 2020-09-09 DIAGNOSIS — O99282 Endocrine, nutritional and metabolic diseases complicating pregnancy, second trimester: Secondary | ICD-10-CM | POA: Diagnosis not present

## 2020-09-09 DIAGNOSIS — O43193 Other malformation of placenta, third trimester: Secondary | ICD-10-CM | POA: Diagnosis not present

## 2020-09-09 DIAGNOSIS — O99283 Endocrine, nutritional and metabolic diseases complicating pregnancy, third trimester: Secondary | ICD-10-CM | POA: Diagnosis not present

## 2020-09-09 DIAGNOSIS — O09522 Supervision of elderly multigravida, second trimester: Secondary | ICD-10-CM | POA: Diagnosis not present

## 2020-09-09 DIAGNOSIS — Z3689 Encounter for other specified antenatal screening: Secondary | ICD-10-CM | POA: Diagnosis not present

## 2020-09-09 DIAGNOSIS — O99013 Anemia complicating pregnancy, third trimester: Secondary | ICD-10-CM | POA: Diagnosis not present

## 2020-09-09 DIAGNOSIS — Z3A28 28 weeks gestation of pregnancy: Secondary | ICD-10-CM | POA: Diagnosis not present

## 2020-09-17 ENCOUNTER — Ambulatory Visit: Payer: BLUE CROSS/BLUE SHIELD | Attending: Obstetrics and Gynecology

## 2020-09-17 ENCOUNTER — Other Ambulatory Visit: Payer: Self-pay | Admitting: Obstetrics and Gynecology

## 2020-09-17 ENCOUNTER — Ambulatory Visit (HOSPITAL_BASED_OUTPATIENT_CLINIC_OR_DEPARTMENT_OTHER): Payer: BLUE CROSS/BLUE SHIELD | Admitting: Obstetrics and Gynecology

## 2020-09-17 ENCOUNTER — Ambulatory Visit: Payer: BLUE CROSS/BLUE SHIELD | Admitting: *Deleted

## 2020-09-17 ENCOUNTER — Other Ambulatory Visit: Payer: Self-pay

## 2020-09-17 ENCOUNTER — Other Ambulatory Visit: Payer: Self-pay | Admitting: *Deleted

## 2020-09-17 VITALS — BP 116/72 | HR 107

## 2020-09-17 DIAGNOSIS — O09523 Supervision of elderly multigravida, third trimester: Secondary | ICD-10-CM

## 2020-09-17 DIAGNOSIS — O365930 Maternal care for other known or suspected poor fetal growth, third trimester, not applicable or unspecified: Secondary | ICD-10-CM | POA: Diagnosis not present

## 2020-09-17 DIAGNOSIS — O34219 Maternal care for unspecified type scar from previous cesarean delivery: Secondary | ICD-10-CM

## 2020-09-17 DIAGNOSIS — O99283 Endocrine, nutritional and metabolic diseases complicating pregnancy, third trimester: Secondary | ICD-10-CM | POA: Diagnosis not present

## 2020-09-17 DIAGNOSIS — O99413 Diseases of the circulatory system complicating pregnancy, third trimester: Secondary | ICD-10-CM

## 2020-09-17 DIAGNOSIS — I341 Nonrheumatic mitral (valve) prolapse: Secondary | ICD-10-CM

## 2020-09-17 DIAGNOSIS — Z3A29 29 weeks gestation of pregnancy: Secondary | ICD-10-CM | POA: Diagnosis not present

## 2020-09-17 DIAGNOSIS — I5189 Other ill-defined heart diseases: Secondary | ICD-10-CM

## 2020-09-17 DIAGNOSIS — Z9889 Other specified postprocedural states: Secondary | ICD-10-CM

## 2020-09-17 DIAGNOSIS — O43193 Other malformation of placenta, third trimester: Secondary | ICD-10-CM

## 2020-09-17 DIAGNOSIS — O365990 Maternal care for other known or suspected poor fetal growth, unspecified trimester, not applicable or unspecified: Secondary | ICD-10-CM

## 2020-09-17 DIAGNOSIS — E039 Hypothyroidism, unspecified: Secondary | ICD-10-CM

## 2020-09-17 DIAGNOSIS — O10913 Unspecified pre-existing hypertension complicating pregnancy, third trimester: Secondary | ICD-10-CM

## 2020-09-17 DIAGNOSIS — O2693 Pregnancy related conditions, unspecified, third trimester: Secondary | ICD-10-CM | POA: Diagnosis not present

## 2020-09-17 NOTE — Progress Notes (Signed)
Maternal-Fetal Medicine   Name: Samantha Meyer DOB: 1986/06/22 MRN: [id number] Referring Provider: Rhoderick Moody, MD  I had the pleasure of seeing Ms. Sam[facility] at [facility]. She is G3 P1 at 29w 2d gestation and is here for ultrasound and consultation because of her mitral valve prolapse.  Past medical history is significant for mitral valve prolapse diagnosed in adolescence.  At diagnosis patient had shortness of breath, tiredness, and arrhythmias.  She does not give history of thrombotic complications or infective endocarditis.  Her previous pregnancy was uneventful when she did not have mitral valve prolapse repair.  Patient underwent repair in 2013 and has been in good health since the surgery.  She does not have difficulty in climbing stairs or walking fast and is able to tolerate moderate to severe activities without symptoms.  Patient reports she had a hypertension early in pregnancy but is not taking any antihypertensives.  Her blood pressure today at her office is 116/72 mmHg.  She takes low-dose aspirin prophylaxis.  Her past medical history is also significant for thyroidectomy in 2016 for hyperactive nodules.  She had hyperthyroidism.  Since surgery, she is taking levothyroxine supplements and is euthyroid now.  She does not have diabetes or any other chronic medical conditions. Past surgical history: Cesarean delivery, thyroidectomy, mitral valve prolapse repair. Medications: Prenatal vitamins, aspirin, levothyroxine.  Allergies: No known drug allergies. Social history: Denies tobacco or drug or alcohol use.  She has been married 13 years and her husband is in good health.  Both patient and and her husband do not have sickle cell trait.  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 Family history: No history of venous thromboembolism in the family.  Mother is a breast cancer survivor and is doing well now.  Obstetric history significant for a  term cesarean delivery in 2009 of a female infant weighing 8 pounds 8 ounces at birth.  Cesarean section was performed for failure to progress in labor.  Her son Samantha Meyer.) has a diagnosis of juvenile idiopathic arthritis.  GYN history: Previous cycles were irregular.  No history of breast disease.  No history of abnormal Pap smears or cervical surgeries.  Prenatal course: On cell free fetal DNA screening, the risks of fetal aneuploidies are not increased.  Midtrimester fetal anatomy scan performed at your office was reportedly normal.  She does not have gestational diabetes.  Ultrasound: We performed ultrasound today.  The estimated fetal weight is at the 8th percentile.  Head circumference and abdominal circumference measurements are at greater than the 10th percentile.  Femur length measurement is at the 2nd percentile.  Amniotic fluid is normal and good fetal activity seen. Fetal anatomical survey appears normal but limited by advanced gestational age.  Marginal cord insertion is seen.  Umbilical artery Doppler study showed increased S/D ratio.  NST is reactive.   Mitral valve prolapse in pregnancy (MVP) -MVP is defined as the prolapse of one or both mitral valve leaflets up to 2 millimeters beyond the annual plane and the valves may be thickened. Severe prolapse leads to mitral regurgitation. -It is the most common valvular heart disease in women of reproductive age group.  Prevalence is 1% to 3% in general population. -Symptoms include shortness of breath, dizziness, palpitations, and faintness.  -Complications are rare in younger women and they include arrhythmia, infective endocarditis and cerebral ischemic complications. -No major studies are available to understand its course and complications in pregnancy. -Pregnancy classification falls under [facility]  class I (less likely to have adverse cardiac events). -One retrospective study of hospital admissions over 8 years investigated the database from  [place] Readmission Database (NRD) and Geophysical data processor (HCUP) (Cardiac and Obstetric outcomes associated with Mitral Valve Prolapse, Harold Hedge al. Am J Cardiol.2021 Oct 21:[id number]) -They identified 22,926 pregnancy/delivery admissions using ICD9 and ICD 10 codes. There were 4 maternal deaths and 315 readmissions. Increased rates of cardiac complications including cardiac arrest, arrhythmias, heart failure and stroke were seen. Absolute risk of maternal death was very low. -Our patient had mitral valve repair in 2013 and has not had major complications. She does not have arrhythmias or other serious symptoms. Recent Echo apparently showed EF 45% to 50%. -Obstetric complications including preterm delivery, preeclampsia, and fetal growth restrictions have been reported. I reassured her that the likelihood of major adverse cardiac events is very low in this pregnancy.  Fetal growth restriction I counseled the couple on possible causes of fetal growth restriction including placental insufficiency, fetal chromosomal anomalies or genetic conditions and fetal infections (unlikely). Patient had cell-free fetal DNA screening that is reassuring for screening Down syndrome. It does not, however, identify all chromosomes. I discussed fetal surveillance including weekly umbilical artery Doppler and BPP (from 32 weeks). Timing of delivery will be based on interval growth and abnormal antenatal testing if present.  Previous cesarean delivery I counseled the patient that repeat cesarean deliveries increase the likelihood of previa or accreta in subsequent pregnancies. I counseled the patient on VBAC including scar rupture during labor (1%).   History of thyroidectomy: Patient is euthyroid now and takes levothyroxine supplements. I do not expect any adverse outcomes from her history of thyroid disorder. She should continue levothyroxine.  Recommendations: -Weekly UA  Doppler studies and NST (appointments were made). -Fetal growth assessment in 3 weeks. -Weekly BPP from 32 weeks' gestation till delivery. -We will make recommendations on timing of delivery based on fetal growth and antenatal testing. -Maternal echocardiography report to be faxed to our office. -TSH levels if not checked in the past 6 weeks.  Thank you for consultation.  If you have any questions or concerns, please call me at the [facility].  Consultation including face-to-face counseling 45 minutes.

## 2020-09-17 NOTE — Procedures (Signed)
Samantha Meyer [birth year] [redacted]w[redacted]d  Fetus A Non-Stress Test Interpretation for 09/17/20  Indication: IUGR  Fetal Heart Rate A Mode: External Baseline Rate (A): 155 bpm Variability: Moderate Accelerations: 10 x 10 Decelerations: None Multiple birth?: No  Uterine Activity Mode: Palpation, Toco Contraction Frequency (min): U/I noted Contraction Duration (sec): 30 Contraction Quality: Mild Resting Tone Palpated: Relaxed Resting Time: Adequate  Interpretation (Fetal Testing) Nonstress Test Interpretation: Reactive Overall Impression: Reassuring for gestational age Comments: Reviewed tracing with Dr. Judeth Cornfield

## 2020-09-24 ENCOUNTER — Encounter: Payer: Self-pay | Admitting: Internal Medicine

## 2020-09-24 ENCOUNTER — Other Ambulatory Visit: Payer: Self-pay | Admitting: Internal Medicine

## 2020-09-24 ENCOUNTER — Telehealth: Payer: Self-pay | Admitting: Internal Medicine

## 2020-09-24 DIAGNOSIS — E890 Postprocedural hypothyroidism: Secondary | ICD-10-CM

## 2020-09-24 DIAGNOSIS — O10013 Pre-existing essential hypertension complicating pregnancy, third trimester: Secondary | ICD-10-CM | POA: Diagnosis not present

## 2020-09-24 DIAGNOSIS — O09523 Supervision of elderly multigravida, third trimester: Secondary | ICD-10-CM | POA: Diagnosis not present

## 2020-09-24 DIAGNOSIS — O365930 Maternal care for other known or suspected poor fetal growth, third trimester, not applicable or unspecified: Secondary | ICD-10-CM | POA: Diagnosis not present

## 2020-09-24 DIAGNOSIS — O99283 Endocrine, nutritional and metabolic diseases complicating pregnancy, third trimester: Secondary | ICD-10-CM | POA: Diagnosis not present

## 2020-09-24 MED ORDER — LEVOTHYROXINE SODIUM 125 MCG PO TABS
125.0000 ug | ORAL_TABLET | Freq: Every day | ORAL | 3 refills | Status: DC
Start: 2020-09-24 — End: 2021-02-25

## 2020-09-24 NOTE — Telephone Encounter (Signed)
patient is calling to follow up on critical Thyroid labs sent to this practice from [facility] today by fax. She is [redacted] week gestation, current labs are negatively impacting pregnancy and she needs to know how to adjust her medication before weekend begins  Call back number is [phone] - pateint

## 2020-09-24 NOTE — Telephone Encounter (Signed)
Samantha Ana RN with [facility] ph# [phone], ext [phone] called re: Samantha Meyer is faxing over labs (TSH) 0.34 and patient is [redacted] weeks pregnant. Samantha Meyer states Patient has been informed. Samantha Meyer states patient is on Levothyroxine 137 mcg.

## 2020-09-24 NOTE — Telephone Encounter (Signed)
I did not see blood work on fax, or in recent faxes.  Called [place] Ana back and advised that I did not see them to refax them, gave fax number.   Will route to MD to make aware of the given TSH level.  Thanks!

## 2020-09-24 NOTE — Telephone Encounter (Signed)
Patient called to follow up on fax from [facility] regarding her thyroid labs - call back number [phone]

## 2020-09-27 ENCOUNTER — Ambulatory Visit: Payer: BLUE CROSS/BLUE SHIELD | Admitting: *Deleted

## 2020-09-27 ENCOUNTER — Other Ambulatory Visit: Payer: Self-pay

## 2020-09-27 ENCOUNTER — Encounter: Payer: Self-pay | Admitting: *Deleted

## 2020-09-27 ENCOUNTER — Ambulatory Visit: Payer: BLUE CROSS/BLUE SHIELD

## 2020-09-27 ENCOUNTER — Ambulatory Visit: Payer: BLUE CROSS/BLUE SHIELD | Attending: Obstetrics and Gynecology

## 2020-09-27 DIAGNOSIS — O09523 Supervision of elderly multigravida, third trimester: Secondary | ICD-10-CM

## 2020-09-27 DIAGNOSIS — O10913 Unspecified pre-existing hypertension complicating pregnancy, third trimester: Secondary | ICD-10-CM

## 2020-09-27 DIAGNOSIS — O99283 Endocrine, nutritional and metabolic diseases complicating pregnancy, third trimester: Secondary | ICD-10-CM | POA: Diagnosis not present

## 2020-09-27 DIAGNOSIS — O34219 Maternal care for unspecified type scar from previous cesarean delivery: Secondary | ICD-10-CM

## 2020-09-27 DIAGNOSIS — O99413 Diseases of the circulatory system complicating pregnancy, third trimester: Secondary | ICD-10-CM

## 2020-09-27 DIAGNOSIS — O365930 Maternal care for other known or suspected poor fetal growth, third trimester, not applicable or unspecified: Secondary | ICD-10-CM

## 2020-09-27 DIAGNOSIS — O43193 Other malformation of placenta, third trimester: Secondary | ICD-10-CM

## 2020-09-27 DIAGNOSIS — E039 Hypothyroidism, unspecified: Secondary | ICD-10-CM

## 2020-09-27 DIAGNOSIS — O365990 Maternal care for other known or suspected poor fetal growth, unspecified trimester, not applicable or unspecified: Secondary | ICD-10-CM | POA: Insufficient documentation

## 2020-09-27 DIAGNOSIS — Z3A30 30 weeks gestation of pregnancy: Secondary | ICD-10-CM

## 2020-09-27 NOTE — Progress Notes (Signed)
States "NST done on 09/24/20 in [place] office"

## 2020-09-27 NOTE — Telephone Encounter (Signed)
Patient was followed up via mychart message.

## 2020-10-04 ENCOUNTER — Ambulatory Visit: Payer: BLUE CROSS/BLUE SHIELD | Admitting: *Deleted

## 2020-10-04 ENCOUNTER — Encounter: Payer: Self-pay | Admitting: *Deleted

## 2020-10-04 ENCOUNTER — Ambulatory Visit (HOSPITAL_BASED_OUTPATIENT_CLINIC_OR_DEPARTMENT_OTHER): Payer: BLUE CROSS/BLUE SHIELD | Admitting: *Deleted

## 2020-10-04 ENCOUNTER — Other Ambulatory Visit: Payer: Self-pay

## 2020-10-04 ENCOUNTER — Ambulatory Visit: Payer: BLUE CROSS/BLUE SHIELD | Attending: Obstetrics and Gynecology

## 2020-10-04 VITALS — BP 118/78 | HR 103

## 2020-10-04 DIAGNOSIS — Z3A31 31 weeks gestation of pregnancy: Secondary | ICD-10-CM | POA: Insufficient documentation

## 2020-10-04 DIAGNOSIS — O43193 Other malformation of placenta, third trimester: Secondary | ICD-10-CM

## 2020-10-04 DIAGNOSIS — O09523 Supervision of elderly multigravida, third trimester: Secondary | ICD-10-CM

## 2020-10-04 DIAGNOSIS — O365931 Maternal care for other known or suspected poor fetal growth, third trimester, fetus 1: Secondary | ICD-10-CM

## 2020-10-04 DIAGNOSIS — Z362 Encounter for other antenatal screening follow-up: Secondary | ICD-10-CM

## 2020-10-04 DIAGNOSIS — O99283 Endocrine, nutritional and metabolic diseases complicating pregnancy, third trimester: Secondary | ICD-10-CM | POA: Diagnosis not present

## 2020-10-04 DIAGNOSIS — O365990 Maternal care for other known or suspected poor fetal growth, unspecified trimester, not applicable or unspecified: Secondary | ICD-10-CM | POA: Insufficient documentation

## 2020-10-04 DIAGNOSIS — O10919 Unspecified pre-existing hypertension complicating pregnancy, unspecified trimester: Secondary | ICD-10-CM | POA: Diagnosis not present

## 2020-10-04 DIAGNOSIS — O365930 Maternal care for other known or suspected poor fetal growth, third trimester, not applicable or unspecified: Secondary | ICD-10-CM | POA: Insufficient documentation

## 2020-10-04 DIAGNOSIS — O10913 Unspecified pre-existing hypertension complicating pregnancy, third trimester: Secondary | ICD-10-CM

## 2020-10-04 DIAGNOSIS — E039 Hypothyroidism, unspecified: Secondary | ICD-10-CM

## 2020-10-04 DIAGNOSIS — O2693 Pregnancy related conditions, unspecified, third trimester: Secondary | ICD-10-CM | POA: Diagnosis not present

## 2020-10-04 DIAGNOSIS — O34219 Maternal care for unspecified type scar from previous cesarean delivery: Secondary | ICD-10-CM

## 2020-10-04 DIAGNOSIS — O99513 Diseases of the respiratory system complicating pregnancy, third trimester: Secondary | ICD-10-CM

## 2020-10-04 NOTE — Procedures (Signed)
Samantha Meyer 1986/08/02 [redacted]w[redacted]d  Fetus A Non-Stress Test Interpretation for 10/04/20  Indication: IUGR and Chronic Hypertenstion  Fetal Heart Rate A Mode: External Baseline Rate (A): 150 bpm Variability: Moderate Accelerations: 15 x 15 Decelerations: None Multiple birth?: No  Uterine Activity Mode: Palpation, Toco Contraction Frequency (min): UI Contraction Quality: Mild Resting Tone Palpated: Relaxed Resting Time: Adequate  Interpretation (Fetal Testing) Nonstress Test Interpretation: Reactive Comments: Dr. Parke Poisson reviewed tracing.

## 2020-10-05 ENCOUNTER — Other Ambulatory Visit: Payer: Self-pay | Admitting: *Deleted

## 2020-10-05 DIAGNOSIS — O365930 Maternal care for other known or suspected poor fetal growth, third trimester, not applicable or unspecified: Secondary | ICD-10-CM

## 2020-10-05 DIAGNOSIS — Z3490 Encounter for supervision of normal pregnancy, unspecified, unspecified trimester: Secondary | ICD-10-CM | POA: Insufficient documentation

## 2020-10-08 DIAGNOSIS — O365930 Maternal care for other known or suspected poor fetal growth, third trimester, not applicable or unspecified: Secondary | ICD-10-CM | POA: Diagnosis not present

## 2020-10-08 DIAGNOSIS — O99283 Endocrine, nutritional and metabolic diseases complicating pregnancy, third trimester: Secondary | ICD-10-CM | POA: Diagnosis not present

## 2020-10-08 DIAGNOSIS — O43193 Other malformation of placenta, third trimester: Secondary | ICD-10-CM | POA: Diagnosis not present

## 2020-10-08 DIAGNOSIS — O10013 Pre-existing essential hypertension complicating pregnancy, third trimester: Secondary | ICD-10-CM | POA: Diagnosis not present

## 2020-10-11 ENCOUNTER — Ambulatory Visit (HOSPITAL_BASED_OUTPATIENT_CLINIC_OR_DEPARTMENT_OTHER): Payer: BLUE CROSS/BLUE SHIELD | Admitting: Obstetrics and Gynecology

## 2020-10-11 ENCOUNTER — Ambulatory Visit: Payer: BLUE CROSS/BLUE SHIELD | Admitting: *Deleted

## 2020-10-11 ENCOUNTER — Other Ambulatory Visit: Payer: Self-pay | Admitting: *Deleted

## 2020-10-11 ENCOUNTER — Ambulatory Visit: Payer: BLUE CROSS/BLUE SHIELD | Attending: Obstetrics and Gynecology

## 2020-10-11 ENCOUNTER — Encounter: Payer: Self-pay | Admitting: *Deleted

## 2020-10-11 ENCOUNTER — Other Ambulatory Visit: Payer: Self-pay

## 2020-10-11 VITALS — BP 133/80 | HR 98

## 2020-10-11 DIAGNOSIS — O2693 Pregnancy related conditions, unspecified, third trimester: Secondary | ICD-10-CM

## 2020-10-11 DIAGNOSIS — O09523 Supervision of elderly multigravida, third trimester: Secondary | ICD-10-CM

## 2020-10-11 DIAGNOSIS — O365990 Maternal care for other known or suspected poor fetal growth, unspecified trimester, not applicable or unspecified: Secondary | ICD-10-CM | POA: Diagnosis not present

## 2020-10-11 DIAGNOSIS — Z3A32 32 weeks gestation of pregnancy: Secondary | ICD-10-CM

## 2020-10-11 DIAGNOSIS — O99283 Endocrine, nutritional and metabolic diseases complicating pregnancy, third trimester: Secondary | ICD-10-CM | POA: Diagnosis not present

## 2020-10-11 DIAGNOSIS — O99413 Diseases of the circulatory system complicating pregnancy, third trimester: Secondary | ICD-10-CM

## 2020-10-11 DIAGNOSIS — E039 Hypothyroidism, unspecified: Secondary | ICD-10-CM

## 2020-10-11 DIAGNOSIS — O10913 Unspecified pre-existing hypertension complicating pregnancy, third trimester: Secondary | ICD-10-CM | POA: Insufficient documentation

## 2020-10-11 DIAGNOSIS — O34219 Maternal care for unspecified type scar from previous cesarean delivery: Secondary | ICD-10-CM

## 2020-10-11 DIAGNOSIS — O365930 Maternal care for other known or suspected poor fetal growth, third trimester, not applicable or unspecified: Secondary | ICD-10-CM | POA: Diagnosis not present

## 2020-10-11 DIAGNOSIS — O43193 Other malformation of placenta, third trimester: Secondary | ICD-10-CM | POA: Diagnosis not present

## 2020-10-11 NOTE — Procedures (Signed)
Dajahnae SCARLETT PORTLOCK 1986/11/20 [redacted]w[redacted]d  Fetus A Non-Stress Test Interpretation for 10/11/20  Indication: IUGR and Chronic Hypertenstion  Fetal Heart Rate A Mode: External Baseline Rate (A): 150 bpm Variability: Moderate Accelerations: 15 x 15 Decelerations: None Multiple birth?: No  Uterine Activity Mode: Palpation, Toco Contraction Frequency (min): UI Contraction Quality: Mild Resting Tone Palpated: Relaxed Resting Time: Adequate  Interpretation (Fetal Testing) Nonstress Test Interpretation: Reactive Comments: Dr. Judeth Cornfield reviewed tracing.

## 2020-10-13 DIAGNOSIS — I5189 Other ill-defined heart diseases: Secondary | ICD-10-CM | POA: Diagnosis not present

## 2020-10-13 DIAGNOSIS — I340 Nonrheumatic mitral (valve) insufficiency: Secondary | ICD-10-CM | POA: Diagnosis not present

## 2020-10-18 ENCOUNTER — Other Ambulatory Visit: Payer: Self-pay

## 2020-10-18 ENCOUNTER — Ambulatory Visit: Payer: BLUE CROSS/BLUE SHIELD | Attending: Obstetrics and Gynecology | Admitting: *Deleted

## 2020-10-18 ENCOUNTER — Ambulatory Visit: Payer: BLUE CROSS/BLUE SHIELD | Admitting: *Deleted

## 2020-10-18 ENCOUNTER — Encounter: Payer: Self-pay | Admitting: *Deleted

## 2020-10-18 VITALS — BP 126/81 | HR 103

## 2020-10-18 DIAGNOSIS — Z3A33 33 weeks gestation of pregnancy: Secondary | ICD-10-CM | POA: Diagnosis not present

## 2020-10-18 DIAGNOSIS — O365990 Maternal care for other known or suspected poor fetal growth, unspecified trimester, not applicable or unspecified: Secondary | ICD-10-CM | POA: Insufficient documentation

## 2020-10-18 DIAGNOSIS — O365930 Maternal care for other known or suspected poor fetal growth, third trimester, not applicable or unspecified: Secondary | ICD-10-CM | POA: Diagnosis not present

## 2020-10-18 NOTE — Procedures (Signed)
Samantha Meyer 09/11/1986 [redacted]w[redacted]d  Fetus A Non-Stress Test Interpretation for 10/18/20  Indication: IUGR  Fetal Heart Rate A Mode: External Baseline Rate (A): 145 bpm Variability: Moderate Accelerations: 15 x 15 Decelerations: None Multiple birth?: No  Uterine Activity Mode: Palpation, Toco Contraction Frequency (min): Occas. UI Contraction Quality: Mild Resting Tone Palpated: Relaxed Resting Time: Adequate  Interpretation (Fetal Testing) Nonstress Test Interpretation: Reactive Comments: Dr. Parke Poisson reviewed tracing.

## 2020-10-22 ENCOUNTER — Other Ambulatory Visit: Payer: Self-pay

## 2020-10-22 ENCOUNTER — Encounter: Payer: Self-pay | Admitting: *Deleted

## 2020-10-22 ENCOUNTER — Ambulatory Visit: Payer: BLUE CROSS/BLUE SHIELD | Admitting: *Deleted

## 2020-10-22 ENCOUNTER — Ambulatory Visit: Payer: BLUE CROSS/BLUE SHIELD | Attending: Obstetrics and Gynecology

## 2020-10-22 VITALS — BP 144/99 | HR 97

## 2020-10-22 DIAGNOSIS — Z3A34 34 weeks gestation of pregnancy: Secondary | ICD-10-CM | POA: Diagnosis not present

## 2020-10-22 DIAGNOSIS — O2693 Pregnancy related conditions, unspecified, third trimester: Secondary | ICD-10-CM | POA: Diagnosis not present

## 2020-10-22 DIAGNOSIS — O43193 Other malformation of placenta, third trimester: Secondary | ICD-10-CM | POA: Diagnosis not present

## 2020-10-22 DIAGNOSIS — E039 Hypothyroidism, unspecified: Secondary | ICD-10-CM

## 2020-10-22 DIAGNOSIS — O99283 Endocrine, nutritional and metabolic diseases complicating pregnancy, third trimester: Secondary | ICD-10-CM

## 2020-10-22 DIAGNOSIS — O365930 Maternal care for other known or suspected poor fetal growth, third trimester, not applicable or unspecified: Secondary | ICD-10-CM

## 2020-10-22 DIAGNOSIS — O10913 Unspecified pre-existing hypertension complicating pregnancy, third trimester: Secondary | ICD-10-CM

## 2020-10-22 DIAGNOSIS — Z362 Encounter for other antenatal screening follow-up: Secondary | ICD-10-CM

## 2020-10-22 DIAGNOSIS — O99413 Diseases of the circulatory system complicating pregnancy, third trimester: Secondary | ICD-10-CM

## 2020-10-22 DIAGNOSIS — O10013 Pre-existing essential hypertension complicating pregnancy, third trimester: Secondary | ICD-10-CM | POA: Diagnosis not present

## 2020-10-22 DIAGNOSIS — O09523 Supervision of elderly multigravida, third trimester: Secondary | ICD-10-CM

## 2020-10-22 DIAGNOSIS — O34219 Maternal care for unspecified type scar from previous cesarean delivery: Secondary | ICD-10-CM

## 2020-10-22 NOTE — Progress Notes (Signed)
C/o" swelling in ankles with pain in right ankle since yesterday."

## 2020-10-25 ENCOUNTER — Other Ambulatory Visit (INDEPENDENT_AMBULATORY_CARE_PROVIDER_SITE_OTHER): Payer: BLUE CROSS/BLUE SHIELD

## 2020-10-25 ENCOUNTER — Ambulatory Visit: Payer: BLUE CROSS/BLUE SHIELD

## 2020-10-25 ENCOUNTER — Encounter: Payer: Self-pay | Admitting: Internal Medicine

## 2020-10-25 ENCOUNTER — Other Ambulatory Visit: Payer: Self-pay

## 2020-10-25 DIAGNOSIS — E890 Postprocedural hypothyroidism: Secondary | ICD-10-CM | POA: Diagnosis not present

## 2020-10-25 LAB — T4, FREE: Free T4: 0.61 ng/dL (ref 0.60–1.60)

## 2020-10-25 LAB — TSH: TSH: 1.37 u[IU]/mL (ref 0.35–4.50)

## 2020-10-27 DIAGNOSIS — O09529 Supervision of elderly multigravida, unspecified trimester: Secondary | ICD-10-CM | POA: Diagnosis not present

## 2020-10-27 DIAGNOSIS — O10019 Pre-existing essential hypertension complicating pregnancy, unspecified trimester: Secondary | ICD-10-CM | POA: Diagnosis not present

## 2020-10-28 DIAGNOSIS — O10013 Pre-existing essential hypertension complicating pregnancy, third trimester: Secondary | ICD-10-CM | POA: Insufficient documentation

## 2020-10-29 ENCOUNTER — Encounter: Payer: Self-pay | Admitting: *Deleted

## 2020-10-29 ENCOUNTER — Other Ambulatory Visit: Payer: Self-pay | Admitting: Obstetrics

## 2020-10-29 ENCOUNTER — Other Ambulatory Visit: Payer: Self-pay | Admitting: *Deleted

## 2020-10-29 ENCOUNTER — Other Ambulatory Visit: Payer: Self-pay

## 2020-10-29 ENCOUNTER — Ambulatory Visit: Payer: BLUE CROSS/BLUE SHIELD | Attending: Obstetrics and Gynecology

## 2020-10-29 ENCOUNTER — Ambulatory Visit: Payer: BLUE CROSS/BLUE SHIELD | Admitting: *Deleted

## 2020-10-29 VITALS — BP 130/83 | HR 102

## 2020-10-29 DIAGNOSIS — O365930 Maternal care for other known or suspected poor fetal growth, third trimester, not applicable or unspecified: Secondary | ICD-10-CM | POA: Diagnosis not present

## 2020-10-29 DIAGNOSIS — O43193 Other malformation of placenta, third trimester: Secondary | ICD-10-CM | POA: Diagnosis not present

## 2020-10-29 DIAGNOSIS — O2693 Pregnancy related conditions, unspecified, third trimester: Secondary | ICD-10-CM

## 2020-10-29 DIAGNOSIS — O99283 Endocrine, nutritional and metabolic diseases complicating pregnancy, third trimester: Secondary | ICD-10-CM

## 2020-10-29 DIAGNOSIS — Z3A35 35 weeks gestation of pregnancy: Secondary | ICD-10-CM | POA: Diagnosis not present

## 2020-10-29 DIAGNOSIS — Z3A39 39 weeks gestation of pregnancy: Secondary | ICD-10-CM

## 2020-10-29 DIAGNOSIS — O10019 Pre-existing essential hypertension complicating pregnancy, unspecified trimester: Secondary | ICD-10-CM | POA: Diagnosis not present

## 2020-10-29 DIAGNOSIS — O09523 Supervision of elderly multigravida, third trimester: Secondary | ICD-10-CM

## 2020-10-29 DIAGNOSIS — O34219 Maternal care for unspecified type scar from previous cesarean delivery: Secondary | ICD-10-CM

## 2020-10-29 DIAGNOSIS — O10913 Unspecified pre-existing hypertension complicating pregnancy, third trimester: Secondary | ICD-10-CM

## 2020-10-29 DIAGNOSIS — O99413 Diseases of the circulatory system complicating pregnancy, third trimester: Secondary | ICD-10-CM

## 2020-10-29 DIAGNOSIS — Z362 Encounter for other antenatal screening follow-up: Secondary | ICD-10-CM

## 2020-10-29 DIAGNOSIS — E039 Hypothyroidism, unspecified: Secondary | ICD-10-CM

## 2020-11-01 DIAGNOSIS — O10019 Pre-existing essential hypertension complicating pregnancy, unspecified trimester: Secondary | ICD-10-CM | POA: Diagnosis not present

## 2020-11-01 DIAGNOSIS — O10013 Pre-existing essential hypertension complicating pregnancy, third trimester: Secondary | ICD-10-CM | POA: Diagnosis not present

## 2020-11-01 DIAGNOSIS — Z3A35 35 weeks gestation of pregnancy: Secondary | ICD-10-CM | POA: Diagnosis not present

## 2020-11-03 ENCOUNTER — Ambulatory Visit: Payer: BLUE CROSS/BLUE SHIELD | Attending: Obstetrics and Gynecology

## 2020-11-03 ENCOUNTER — Encounter: Payer: Self-pay | Admitting: *Deleted

## 2020-11-03 ENCOUNTER — Other Ambulatory Visit: Payer: Self-pay

## 2020-11-03 ENCOUNTER — Other Ambulatory Visit: Payer: Self-pay | Admitting: Obstetrics and Gynecology

## 2020-11-03 ENCOUNTER — Ambulatory Visit: Payer: BLUE CROSS/BLUE SHIELD | Admitting: *Deleted

## 2020-11-03 VITALS — BP 120/71 | HR 102

## 2020-11-03 DIAGNOSIS — O365990 Maternal care for other known or suspected poor fetal growth, unspecified trimester, not applicable or unspecified: Secondary | ICD-10-CM

## 2020-11-03 DIAGNOSIS — O43193 Other malformation of placenta, third trimester: Secondary | ICD-10-CM | POA: Diagnosis not present

## 2020-11-03 DIAGNOSIS — O365930 Maternal care for other known or suspected poor fetal growth, third trimester, not applicable or unspecified: Secondary | ICD-10-CM

## 2020-11-03 DIAGNOSIS — O99283 Endocrine, nutritional and metabolic diseases complicating pregnancy, third trimester: Secondary | ICD-10-CM | POA: Diagnosis not present

## 2020-11-03 DIAGNOSIS — O10913 Unspecified pre-existing hypertension complicating pregnancy, third trimester: Secondary | ICD-10-CM

## 2020-11-03 DIAGNOSIS — E039 Hypothyroidism, unspecified: Secondary | ICD-10-CM

## 2020-11-03 DIAGNOSIS — O365921 Maternal care for other known or suspected poor fetal growth, second trimester, fetus 1: Secondary | ICD-10-CM | POA: Diagnosis not present

## 2020-11-03 DIAGNOSIS — O365931 Maternal care for other known or suspected poor fetal growth, third trimester, fetus 1: Secondary | ICD-10-CM | POA: Diagnosis present

## 2020-11-03 DIAGNOSIS — O34219 Maternal care for unspecified type scar from previous cesarean delivery: Secondary | ICD-10-CM

## 2020-11-03 DIAGNOSIS — I341 Nonrheumatic mitral (valve) prolapse: Secondary | ICD-10-CM | POA: Diagnosis not present

## 2020-11-03 DIAGNOSIS — Z3A36 36 weeks gestation of pregnancy: Secondary | ICD-10-CM | POA: Diagnosis not present

## 2020-11-03 DIAGNOSIS — O09523 Supervision of elderly multigravida, third trimester: Secondary | ICD-10-CM

## 2020-11-03 DIAGNOSIS — O1403 Mild to moderate pre-eclampsia, third trimester: Secondary | ICD-10-CM | POA: Diagnosis not present

## 2020-11-03 DIAGNOSIS — O2693 Pregnancy related conditions, unspecified, third trimester: Secondary | ICD-10-CM | POA: Diagnosis not present

## 2020-11-03 DIAGNOSIS — Z362 Encounter for other antenatal screening follow-up: Secondary | ICD-10-CM

## 2020-11-03 DIAGNOSIS — O99413 Diseases of the circulatory system complicating pregnancy, third trimester: Secondary | ICD-10-CM

## 2020-11-03 NOTE — Procedures (Signed)
Samantha Meyer 07-27-1986 [redacted]w[redacted]d  Fetus A Non-Stress Test Interpretation for 11/03/20  Indication: IUGR  Fetal Heart Rate A Mode: External Baseline Rate (A): 145 bpm Variability: Moderate Accelerations: 15 x 15 Decelerations: None Multiple birth?: No  Uterine Activity Mode: Palpation,Toco Contraction Frequency (min): none noted Resting Tone Palpated: Relaxed Resting Time: Adequate  Interpretation (Fetal Testing) Nonstress Test Interpretation: Reactive Comments: Reviewed tracing with Dr. Judeth Cornfield

## 2020-11-04 ENCOUNTER — Other Ambulatory Visit: Payer: Self-pay | Admitting: Obstetrics and Gynecology

## 2020-11-05 ENCOUNTER — Other Ambulatory Visit: Payer: Self-pay | Admitting: Obstetrics and Gynecology

## 2020-11-05 ENCOUNTER — Encounter (HOSPITAL_COMMUNITY): Payer: Self-pay

## 2020-11-05 NOTE — Patient Instructions (Addendum)
TERRICKA ONOFRIO  11/05/2020   Your procedure is scheduled on:  11/10/2020  Arrive at 1100 at Entrance C on [address] at [facility]  and [address]. You are invited to use the FREE valet parking or use the Visitor's parking deck.  Pick up the phone at the desk and dial [phone].  Call this number if you have problems the morning of surgery: [phone]  Remember:   Do not eat food:(After Midnight) Desps de medianoche.  Do not drink clear liquids: (After Midnight) Desps de medianoche.  Take these medicines the morning of surgery with A SIP OF WATER:  Take levothyroxine and labetalol as prescribed   Do not wear jewelry, make-up or nail polish.  Do not wear lotions, powders, or perfumes. Do not wear deodorant.  Do not shave 48 hours prior to surgery.  Do not bring valuables to the hospital.  [facility] is not   responsible for any belongings or valuables brought to the hospital.  Contacts, dentures or bridgework may not be worn into surgery.  Leave suitcase in the car. After surgery it may be brought to your room.  For patients admitted to the hospital, checkout time is 11:00 AM the day of              discharge.      Please read over the following fact sheets that you were given:     Preparing for Surgery

## 2020-11-08 ENCOUNTER — Inpatient Hospital Stay (EMERGENCY_DEPARTMENT_HOSPITAL)
Admission: AD | Admit: 2020-11-08 | Discharge: 2020-11-08 | Disposition: A | Discharge status: Home / Self Care | Payer: BLUE CROSS/BLUE SHIELD | Source: Home / Self Care | Attending: Obstetrics & Gynecology | Admitting: Obstetrics & Gynecology

## 2020-11-08 ENCOUNTER — Encounter (HOSPITAL_COMMUNITY): Payer: Self-pay | Admitting: Obstetrics & Gynecology

## 2020-11-08 ENCOUNTER — Other Ambulatory Visit: Payer: Self-pay

## 2020-11-08 DIAGNOSIS — Z3689 Encounter for other specified antenatal screening: Secondary | ICD-10-CM | POA: Insufficient documentation

## 2020-11-08 DIAGNOSIS — Z3A36 36 weeks gestation of pregnancy: Secondary | ICD-10-CM | POA: Diagnosis not present

## 2020-11-08 DIAGNOSIS — O1403 Mild to moderate pre-eclampsia, third trimester: Secondary | ICD-10-CM | POA: Diagnosis not present

## 2020-11-08 DIAGNOSIS — O34211 Maternal care for low transverse scar from previous cesarean delivery: Secondary | ICD-10-CM | POA: Diagnosis not present

## 2020-11-08 DIAGNOSIS — O1400 Mild to moderate pre-eclampsia, unspecified trimester: Secondary | ICD-10-CM

## 2020-11-08 DIAGNOSIS — O10013 Pre-existing essential hypertension complicating pregnancy, third trimester: Secondary | ICD-10-CM | POA: Diagnosis not present

## 2020-11-08 DIAGNOSIS — O4703 False labor before 37 completed weeks of gestation, third trimester: Secondary | ICD-10-CM

## 2020-11-08 DIAGNOSIS — Z20822 Contact with and (suspected) exposure to covid-19: Secondary | ICD-10-CM | POA: Insufficient documentation

## 2020-11-08 LAB — CBC
HCT: 32.4 % — ABNORMAL LOW (ref 36.0–46.0)
Hemoglobin: 11.2 g/dL — ABNORMAL LOW (ref 12.0–15.0)
MCH: 34.7 pg — ABNORMAL HIGH (ref 26.0–34.0)
MCHC: 34.6 g/dL (ref 30.0–36.0)
MCV: 100.3 fL — ABNORMAL HIGH (ref 80.0–100.0)
Platelets: 241 10*3/uL (ref 150–400)
RBC: 3.23 MIL/uL — ABNORMAL LOW (ref 3.87–5.11)
RDW: 13 % (ref 11.5–15.5)
WBC: 7.7 10*3/uL (ref 4.0–10.5)
nRBC: 0.4 % — ABNORMAL HIGH (ref 0.0–0.2)

## 2020-11-08 LAB — TYPE AND SCREEN
ABO/RH(D): O POS
Antibody Screen: NEGATIVE

## 2020-11-08 LAB — RESP PANEL BY RT-PCR (FLU A&B, COVID) ARPGX2
Influenza A by PCR: NEGATIVE
Influenza B by PCR: NEGATIVE
SARS Coronavirus 2 by RT PCR: NEGATIVE

## 2020-11-08 MED ORDER — BETAMETHASONE SOD PHOS & ACET 6 (3-3) MG/ML IJ SUSP
12.0000 mg | Freq: Once | INTRAMUSCULAR | Status: AC
  Administered 2020-11-08: 16:00:00 12 mg via INTRAMUSCULAR
  Filled 2020-11-08: qty 5

## 2020-11-08 MED ORDER — ACETAMINOPHEN 500 MG PO TABS
1000.0000 mg | ORAL_TABLET | Freq: Once | ORAL | Status: AC
  Administered 2020-11-08: 15:00:00 1000 mg via ORAL
  Filled 2020-11-08: qty 2

## 2020-11-08 NOTE — MAU Note (Signed)
BPP 8/8, ? Blood flow. Had accelerations, ? Decels.  Sent over for further monitoring.  Monitoring for [facility] and IUGR

## 2020-11-08 NOTE — Discharge Instructions (Signed)
Braxton Hicks Contractions Contractions of the uterus can occur throughout pregnancy, but they are not always a sign that you are in labor. You may have practice contractions called Braxton Hicks contractions. These false labor contractions are sometimes confused with true labor. What are Braxton Hicks contractions? Braxton Hicks contractions are tightening movements that occur in the muscles of the uterus before labor. Unlike true labor contractions, these contractions do not result in opening (dilation) and thinning of the cervix. Toward the end of pregnancy (32-34 weeks), Braxton Hicks contractions can happen more often and may become stronger. These contractions are sometimes difficult to tell apart from true labor because they can be very uncomfortable. You should not feel embarrassed if you go to the hospital with false labor. Sometimes, the only way to tell if you are in true labor is for your health care provider to look for changes in the cervix. The health care provider will do a physical exam and may monitor your contractions. If you are not in true labor, the exam should show that your cervix is not dilating and your water has not broken. If there are no other health problems associated with your pregnancy, it is completely safe for you to be sent home with false labor. You may continue to have Braxton Hicks contractions until you go into true labor. How to tell the difference between true labor and false labor True labor  Contractions last 30-70 seconds.  Contractions become very regular.  Discomfort is usually felt in the top of the uterus, and it spreads to the lower abdomen and low back.  Contractions do not go away with walking.  Contractions usually become more intense and increase in frequency.  The cervix dilates and gets thinner. False labor  Contractions are usually shorter and not as strong as true labor contractions.  Contractions are usually irregular.  Contractions  are often felt in the front of the lower abdomen and in the groin.  Contractions may go away when you walk around or change positions while lying down.  Contractions get weaker and are shorter-lasting as time goes on.  The cervix usually does not dilate or become thin. Follow these instructions at home:   Take over-the-counter and prescription medicines only as told by your health care provider.  Keep up with your usual exercises and follow other instructions from your health care provider.  Eat and drink lightly if you think you are going into labor.  If Braxton Hicks contractions are making you uncomfortable: ? Change your position from lying down or resting to walking, or change from walking to resting. ? Sit and rest in a tub of warm water. ? Drink enough fluid to keep your urine pale yellow. Dehydration may cause these contractions. ? Do slow and deep breathing several times an hour.  Keep all follow-up prenatal visits as told by your health care provider. This is important. Contact a health care provider if:  You have a fever.  You have continuous pain in your abdomen. Get help right away if:  Your contractions become stronger, more regular, and closer together.  You have fluid leaking or gushing from your vagina.  You pass blood-tinged mucus (bloody show).  You have bleeding from your vagina.  You have low back pain that you never had before.  You feel your baby's head pushing down and causing pelvic pressure.  Your baby is not moving inside you as much as it used to. Summary  Contractions that occur before labor are   called Braxton Hicks contractions, false labor, or practice contractions.  Braxton Hicks contractions are usually shorter, weaker, farther apart, and less regular than true labor contractions. True labor contractions usually become progressively stronger and regular, and they become more frequent.  Manage discomfort from [facility] contractions  by changing position, resting in a warm bath, drinking plenty of water, or practicing deep breathing. This information is not intended to replace advice given to you by your health care provider. Make sure you discuss any questions you have with your health care provider. Document Revised: 10/19/2017 Document Reviewed: 03/22/2017 Elsevier Patient Education  2020 [employer]vier [employer]   Preeclampsia and Eclampsia Preeclampsia is a serious condition that may develop during pregnancy. This condition causes high blood pressure and increased protein in your urine along with other symptoms, such as headaches and vision changes. These symptoms may develop as the condition gets worse. Preeclampsia may occur at 20 weeks of pregnancy or later. Diagnosing and treating preeclampsia early is very important. If not treated early, it can cause serious problems for you and your baby. One problem it can lead to is eclampsia. Eclampsia is a condition that causes muscle jerking or shaking (convulsions or seizures) and other serious problems for the mother. During pregnancy, delivering your baby may be the best treatment for preeclampsia or eclampsia. For most women, preeclampsia and eclampsia symptoms go away after giving birth. In rare cases, a woman may develop preeclampsia after giving birth (postpartum preeclampsia). This usually occurs within 48 hours after childbirth but may occur up to 6 weeks after giving birth. What are the causes? The cause of preeclampsia is not known. What increases the risk? The following risk factors make you more likely to develop preeclampsia:  Being pregnant for the first time.  Having had preeclampsia during a past pregnancy.  Having a family history of preeclampsia.  Having high blood pressure.  Being pregnant with more than one baby.  Being 55 or older.  Being African-American.  Having kidney disease or diabetes.  Having medical conditions such as lupus or blood  diseases.  Being very overweight (obese). What are the signs or symptoms? The most common symptoms are:  Severe headaches.  Vision problems, such as blurred or double vision.  Abdominal pain, especially upper abdominal pain. Other symptoms that may develop as the condition gets worse include:  Sudden weight gain.  Sudden swelling of the hands, face, legs, and feet.  Severe nausea and vomiting.  Numbness in the face, arms, legs, and feet.  Dizziness.  Urinating less than usual.  Slurred speech.  Convulsions or seizures. How is this diagnosed? There are no screening tests for preeclampsia. Your health care provider will ask you about symptoms and check for signs of preeclampsia during your prenatal visits. You may also have tests that include:  Checking your blood pressure.  Urine tests to check for protein. Your health care provider will check for this at every prenatal visit.  Blood tests.  Monitoring your baby's heart rate.  Ultrasound. How is this treated? You and your health care provider will determine the treatment approach that is best for you. Treatment may include:  Having more frequent prenatal exams to check for signs of preeclampsia, if you have an increased risk for preeclampsia.  Medicine to lower your blood pressure.  Staying in the hospital, if your condition is severe. There, treatment will focus on controlling your blood pressure and the amount of fluids in your body (fluid retention).  Taking medicine (magnesium sulfate) to  prevent seizures. This may be given as an injection or through an IV.  Taking a low-dose aspirin during your pregnancy.  Delivering your baby early. You may have your labor started with medicine (induced), or you may have a cesarean delivery. Follow these instructions at home: Eating and drinking   Drink enough fluid to keep your urine pale yellow.  Avoid caffeine. Lifestyle  Do not use any products that contain  nicotine or tobacco, such as cigarettes and e-cigarettes. If you need help quitting, ask your health care provider.  Do not use alcohol or drugs.  Avoid stress as much as possible. Rest and get plenty of sleep. General instructions  Take over-the-counter and prescription medicines only as told by your health care provider.  When lying down, lie on your left side. This keeps pressure off your major blood vessels.  When sitting or lying down, raise (elevate) your feet. Try putting some pillows underneath your lower legs.  Exercise regularly. Ask your health care provider what kinds of exercise are best for you.  Keep all follow-up and prenatal visits as told by your health care provider. This is important. How is this prevented? There is no known way of preventing preeclampsia or eclampsia from developing. However, to lower your risk of complications and detect problems early:  Get regular prenatal care. Your health care provider may be able to diagnose and treat the condition early.  Maintain a healthy weight. Ask your health care provider for help managing weight gain during pregnancy.  Work with your health care provider to manage any long-term (chronic) health conditions you have, such as diabetes or kidney problems.  You may have tests of your blood pressure and kidney function after giving birth.  Your health care provider may have you take low-dose aspirin during your next pregnancy. Contact a health care provider if:  You have symptoms that your health care provider told you may require more treatment or monitoring, such as: ? Headaches. ? Nausea or vomiting. ? Abdominal pain. ? Dizziness. ? Light-headedness. Get help right away if:  You have severe: ? Abdominal pain. ? Headaches that do not get better. ? Dizziness. ? Vision problems. ? Confusion. ? Nausea or vomiting.  You have any of the following: ? A seizure. ? Sudden, rapid weight gain. ? Sudden swelling in  your hands, ankles, or face. ? Trouble moving any part of your body. ? Numbness in any part of your body. ? Trouble speaking. ? Abnormal bleeding.  You faint. Summary  Preeclampsia is a serious condition that may develop during pregnancy.  This condition causes high blood pressure and increased protein in your urine along with other symptoms, such as headaches and vision changes.  Diagnosing and treating preeclampsia early is very important. If not treated early, it can cause serious problems for you and your baby.  Get help right away if you have symptoms that your health care provider told you to watch for. This information is not intended to replace advice given to you by your health care provider. Make sure you discuss any questions you have with your health care provider. Document Revised: 07/09/2018 Document Reviewed: 06/12/2016 Elsevier Patient Education  2020 [employer].

## 2020-11-08 NOTE — MAU Provider Note (Signed)
Event Date/Time   First Provider Initiated Contact with Patient 11/08/20 1431     [name] is a 34 y.o. G72P1011 pregnant female at [redacted]w[redacted]d who presents to MAU today sent by her community OB after a reactive NST with a questionable decel or change in baseline indicating a need for further monitoring. Baby has IUGR, but a BPP of 8/8 today. Pt is having occasional contractions today but admits to "a lot yesterday." Denies vaginal bleeding/discharge/LOF. Pt also has known cHTN with mild SI PEC and a rCS scheduled for 11/11/20. She also reports a mild headache throughout the morning. She has not taken anything for it.  O BP 134/85 (BP Location: Right Arm)   Pulse 82   Temp 98.2 F (36.8 C) (Oral)   Resp 15   Ht 5\' 6"  (1.676 m)   Wt 184 lb 8 oz (83.7 kg)   LMP 02/25/2020   SpO2 99%   BMI 29.78 kg/m  Physical Exam Vitals and nursing note reviewed.  Constitutional:      General: She is not in acute distress.    Appearance: Normal appearance. She is normal weight. She is not ill-appearing.  Eyes:     Pupils: Pupils are equal, round, and reactive to light.  Cardiovascular:     Rate and Rhythm: Normal rate.  Pulmonary:     Effort: Pulmonary effort is normal.  Genitourinary:    General: Normal vulva.     Comments: Dilation: Closed Effacement (%): Thick Cervical Position: Posterior Station: -3 Presentation: Vertex Exam by:: [email or web address], CNM  Musculoskeletal:        General: Normal range of motion.     Right lower leg: No edema.     Left lower leg: No edema.  Skin:    General: Skin is warm and dry.     Capillary Refill: Capillary refill takes less than 2 seconds.  Neurological:     Mental Status: She is alert and oriented to person, place, and time.  Psychiatric:        Mood and Affect: Mood normal.        Behavior: Behavior normal.        Thought Content: Thought content normal.        Judgment: Judgment normal.    Fetal Tracing: reactive Baseline:  135 Variability: moderate Accelerations: present Decelerations: none Toco: irregular, q10-86min+  1000mg  Tylenol given with good relief of the headache  A NST reactive Preterm contractions in the third trimester Antenatal mild preeclampsia  P Discharge from MAU in stable condition with return labor precautions Pt to get 2nd betamethasone shot tomorrow in her community OB's office See AVS for additional patient eduation  12m 11/08/2020 4:38 PM

## 2020-11-09 ENCOUNTER — Other Ambulatory Visit (HOSPITAL_COMMUNITY): Payer: BLUE CROSS/BLUE SHIELD

## 2020-11-09 ENCOUNTER — Encounter (HOSPITAL_COMMUNITY)
Admission: RE | Admit: 2020-11-09 | Discharge: 2020-11-09 | Disposition: A | Discharge status: Home / Self Care | Payer: BLUE CROSS/BLUE SHIELD | Source: Ambulatory Visit | Attending: Obstetrics and Gynecology | Admitting: Obstetrics and Gynecology

## 2020-11-09 DIAGNOSIS — Z3A36 36 weeks gestation of pregnancy: Secondary | ICD-10-CM | POA: Diagnosis not present

## 2020-11-09 DIAGNOSIS — O10013 Pre-existing essential hypertension complicating pregnancy, third trimester: Secondary | ICD-10-CM | POA: Diagnosis not present

## 2020-11-09 HISTORY — DX: Gestational (pregnancy-induced) hypertension without significant proteinuria, unspecified trimester: O13.9

## 2020-11-09 LAB — RPR: RPR Ser Ql: NONREACTIVE

## 2020-11-10 ENCOUNTER — Inpatient Hospital Stay (HOSPITAL_COMMUNITY)
Admission: AD | Admit: 2020-11-10 | Discharge: 2020-11-12 | DRG: 787 | Disposition: A | Discharge status: Home / Self Care | Payer: BLUE CROSS/BLUE SHIELD | Attending: Obstetrics and Gynecology | Admitting: Obstetrics and Gynecology

## 2020-11-10 ENCOUNTER — Other Ambulatory Visit: Payer: Self-pay | Admitting: Obstetrics and Gynecology

## 2020-11-10 ENCOUNTER — Inpatient Hospital Stay (HOSPITAL_COMMUNITY): Payer: BLUE CROSS/BLUE SHIELD | Admitting: Certified Registered Nurse Anesthetist

## 2020-11-10 ENCOUNTER — Ambulatory Visit: Payer: BLUE CROSS/BLUE SHIELD

## 2020-11-10 ENCOUNTER — Encounter (HOSPITAL_COMMUNITY): Payer: Self-pay | Admitting: Obstetrics and Gynecology

## 2020-11-10 ENCOUNTER — Encounter (HOSPITAL_COMMUNITY)
Admission: AD | Disposition: A | Discharge status: Home / Self Care | Payer: Self-pay | Source: Home / Self Care | Attending: Obstetrics and Gynecology

## 2020-11-10 ENCOUNTER — Other Ambulatory Visit: Payer: Self-pay

## 2020-11-10 ENCOUNTER — Inpatient Hospital Stay (HOSPITAL_COMMUNITY)
Admission: AD | Admit: 2020-11-10 | Discharge: 2020-11-10 | Disposition: A | Discharge status: Home / Self Care | Payer: BLUE CROSS/BLUE SHIELD | Attending: Obstetrics and Gynecology | Admitting: Obstetrics and Gynecology

## 2020-11-10 ENCOUNTER — Inpatient Hospital Stay (HOSPITAL_COMMUNITY)
Admission: RE | Admit: 2020-11-10 | Payer: BLUE CROSS/BLUE SHIELD | Source: Ambulatory Visit | Admitting: Obstetrics and Gynecology

## 2020-11-10 DIAGNOSIS — O1002 Pre-existing essential hypertension complicating childbirth: Secondary | ICD-10-CM | POA: Diagnosis present

## 2020-11-10 DIAGNOSIS — O34211 Maternal care for low transverse scar from previous cesarean delivery: Principal | ICD-10-CM | POA: Diagnosis present

## 2020-11-10 DIAGNOSIS — O365930 Maternal care for other known or suspected poor fetal growth, third trimester, not applicable or unspecified: Secondary | ICD-10-CM | POA: Diagnosis present

## 2020-11-10 DIAGNOSIS — O1410 Severe pre-eclampsia, unspecified trimester: Secondary | ICD-10-CM | POA: Diagnosis present

## 2020-11-10 DIAGNOSIS — E039 Hypothyroidism, unspecified: Secondary | ICD-10-CM | POA: Diagnosis present

## 2020-11-10 DIAGNOSIS — O9081 Anemia of the puerperium: Secondary | ICD-10-CM | POA: Diagnosis not present

## 2020-11-10 DIAGNOSIS — O99824 Streptococcus B carrier state complicating childbirth: Secondary | ICD-10-CM | POA: Diagnosis present

## 2020-11-10 DIAGNOSIS — O99284 Endocrine, nutritional and metabolic diseases complicating childbirth: Secondary | ICD-10-CM | POA: Diagnosis present

## 2020-11-10 DIAGNOSIS — N838 Other noninflammatory disorders of ovary, fallopian tube and broad ligament: Secondary | ICD-10-CM | POA: Diagnosis present

## 2020-11-10 DIAGNOSIS — O3483 Maternal care for other abnormalities of pelvic organs, third trimester: Secondary | ICD-10-CM | POA: Diagnosis present

## 2020-11-10 DIAGNOSIS — D62 Acute posthemorrhagic anemia: Secondary | ICD-10-CM | POA: Diagnosis not present

## 2020-11-10 DIAGNOSIS — Z20822 Contact with and (suspected) exposure to covid-19: Secondary | ICD-10-CM | POA: Diagnosis present

## 2020-11-10 DIAGNOSIS — Z8679 Personal history of other diseases of the circulatory system: Secondary | ICD-10-CM | POA: Diagnosis not present

## 2020-11-10 DIAGNOSIS — Z3A37 37 weeks gestation of pregnancy: Secondary | ICD-10-CM

## 2020-11-10 DIAGNOSIS — Z23 Encounter for immunization: Secondary | ICD-10-CM | POA: Diagnosis not present

## 2020-11-10 DIAGNOSIS — I10 Essential (primary) hypertension: Secondary | ICD-10-CM | POA: Diagnosis present

## 2020-11-10 DIAGNOSIS — O114 Pre-existing hypertension with pre-eclampsia, complicating childbirth: Secondary | ICD-10-CM | POA: Diagnosis present

## 2020-11-10 DIAGNOSIS — Z98891 History of uterine scar from previous surgery: Secondary | ICD-10-CM

## 2020-11-10 LAB — COMPREHENSIVE METABOLIC PANEL
ALT: 37 U/L (ref 0–44)
AST: 85 U/L — ABNORMAL HIGH (ref 15–41)
Albumin: 2.9 g/dL — ABNORMAL LOW (ref 3.5–5.0)
Alkaline Phosphatase: 85 U/L (ref 38–126)
Anion gap: 12 (ref 5–15)
BUN: 11 mg/dL (ref 6–20)
CO2: 19 mmol/L — ABNORMAL LOW (ref 22–32)
Calcium: 9.2 mg/dL (ref 8.9–10.3)
Chloride: 107 mmol/L (ref 98–111)
Creatinine, Ser: 0.85 mg/dL (ref 0.44–1.00)
GFR, Estimated: >60 mL/min (ref >60)
Glucose, Bld: 103 mg/dL — ABNORMAL HIGH (ref 70–99)
Potassium: 4.3 mmol/L (ref 3.5–5.1)
Sodium: 138 mmol/L (ref 135–145)
Total Bilirubin: 0.9 mg/dL (ref 0.3–1.2)
Total Protein: 6.1 g/dL — ABNORMAL LOW (ref 6.5–8.1)

## 2020-11-10 LAB — URIC ACID: Uric Acid, Serum: 6.3 mg/dL (ref 2.5–7.1)

## 2020-11-10 SURGERY — Surgical Case
Anesthesia: Spinal

## 2020-11-10 MED ORDER — DIPHENHYDRAMINE HCL 50 MG/ML IJ SOLN: 12.5000 mg | INTRAMUSCULAR | Status: DC | PRN

## 2020-11-10 MED ORDER — SCOPOLAMINE 1 MG/3DAYS TD PT72
1.0000 | MEDICATED_PATCH | Freq: Once | TRANSDERMAL | Status: DC
  Administered 2020-11-10: 12:00:00 1.5 mg via TRANSDERMAL

## 2020-11-10 MED ORDER — DIBUCAINE (PERIANAL) 1 % EX OINT: 1.0000 "application " | TOPICAL_OINTMENT | CUTANEOUS | Status: DC | PRN

## 2020-11-10 MED ORDER — PROMETHAZINE HCL 25 MG/ML IJ SOLN: 6.2500 mg | INTRAMUSCULAR | Status: DC | PRN

## 2020-11-10 MED ORDER — LABETALOL HCL 5 MG/ML IV SOLN: 40.0000 mg | INTRAVENOUS | Status: DC | PRN

## 2020-11-10 MED ORDER — SCOPOLAMINE 1 MG/3DAYS TD PT72
MEDICATED_PATCH | TRANSDERMAL | Status: AC
  Filled 2020-11-10: qty 1

## 2020-11-10 MED ORDER — MAGNESIUM SULFATE 40 GM/1000ML IV SOLN
INTRAVENOUS | Status: AC
  Filled 2020-11-10: qty 1000

## 2020-11-10 MED ORDER — KETOROLAC TROMETHAMINE 30 MG/ML IJ SOLN
30.0000 mg | Freq: Four times a day (QID) | INTRAMUSCULAR | Status: AC
  Administered 2020-11-11 (×3): 30 mg via INTRAVENOUS
  Filled 2020-11-10 (×3): qty 1

## 2020-11-10 MED ORDER — POVIDONE-IODINE 10 % EX SWAB
2.0000 "application " | Freq: Once | CUTANEOUS | Status: AC
  Administered 2020-11-10: 2 via TOPICAL

## 2020-11-10 MED ORDER — LACTATED RINGERS IV SOLN: INTRAVENOUS | Status: DC

## 2020-11-10 MED ORDER — LACTATED RINGERS IV SOLN
INTRAVENOUS | Status: DC
  Administered 2020-11-11: 05:00:00 75 mL/h via INTRAVENOUS

## 2020-11-10 MED ORDER — LABETALOL HCL 5 MG/ML IV SOLN: 20.0000 mg | INTRAVENOUS | Status: DC | PRN

## 2020-11-10 MED ORDER — MORPHINE SULFATE (PF) 0.5 MG/ML IJ SOLN
INTRAMUSCULAR | Status: AC
  Filled 2020-11-10: qty 10

## 2020-11-10 MED ORDER — ACETAMINOPHEN 500 MG PO TABS: 1000.0000 mg | ORAL_TABLET | Freq: Four times a day (QID) | ORAL | Status: DC

## 2020-11-10 MED ORDER — DIPHENHYDRAMINE HCL 25 MG PO CAPS
25.0000 mg | ORAL_CAPSULE | ORAL | Status: DC | PRN
  Administered 2020-11-10: 22:00:00 25 mg via ORAL
  Filled 2020-11-10: qty 1

## 2020-11-10 MED ORDER — SCOPOLAMINE 1 MG/3DAYS TD PT72: 1.0000 | MEDICATED_PATCH | Freq: Once | TRANSDERMAL | Status: DC

## 2020-11-10 MED ORDER — CEFAZOLIN SODIUM-DEXTROSE 2-4 GM/100ML-% IV SOLN
2.0000 g | INTRAVENOUS | Status: AC
  Administered 2020-11-10: 13:00:00 2 g via INTRAVENOUS
  Filled 2020-11-10: qty 100

## 2020-11-10 MED ORDER — OXYTOCIN-SODIUM CHLORIDE 30-0.9 UT/500ML-% IV SOLN
INTRAVENOUS | Status: AC
  Filled 2020-11-10: qty 500

## 2020-11-10 MED ORDER — MORPHINE SULFATE (PF) 0.5 MG/ML IJ SOLN
INTRAMUSCULAR | Status: DC | PRN
  Administered 2020-11-10: 150 ug via EPIDURAL

## 2020-11-10 MED ORDER — SODIUM CHLORIDE 0.9% FLUSH: 3.0000 mL | INTRAVENOUS | Status: DC | PRN

## 2020-11-10 MED ORDER — COCONUT OIL OIL
1.0000 "application " | TOPICAL_OIL | Status: DC | PRN
  Administered 2020-11-11: 1 via TOPICAL

## 2020-11-10 MED ORDER — PHENYLEPHRINE HCL-NACL 20-0.9 MG/250ML-% IV SOLN
INTRAVENOUS | Status: DC | PRN
  Administered 2020-11-10: 60 ug/min via INTRAVENOUS

## 2020-11-10 MED ORDER — DEXAMETHASONE SODIUM PHOSPHATE 4 MG/ML IJ SOLN
INTRAMUSCULAR | Status: AC
  Filled 2020-11-10: qty 1

## 2020-11-10 MED ORDER — DEXAMETHASONE SODIUM PHOSPHATE 4 MG/ML IJ SOLN
INTRAMUSCULAR | Status: DC | PRN
  Administered 2020-11-10: 4 mg via INTRAVENOUS

## 2020-11-10 MED ORDER — SOD CITRATE-CITRIC ACID 500-334 MG/5ML PO SOLN
30.0000 mL | Freq: Once | ORAL | Status: AC
  Administered 2020-11-10: 12:00:00 30 mL via ORAL

## 2020-11-10 MED ORDER — HYDRALAZINE HCL 20 MG/ML IJ SOLN: 10.0000 mg | INTRAMUSCULAR | Status: DC | PRN

## 2020-11-10 MED ORDER — TETANUS-DIPHTH-ACELL PERTUSSIS 5-2.5-18.5 LF-MCG/0.5 IM SUSY: 0.5000 mL | PREFILLED_SYRINGE | Freq: Once | INTRAMUSCULAR | Status: DC

## 2020-11-10 MED ORDER — SOD CITRATE-CITRIC ACID 500-334 MG/5ML PO SOLN: 30.0000 mL | Freq: Once | ORAL | Status: DC

## 2020-11-10 MED ORDER — DIPHENHYDRAMINE HCL 25 MG PO CAPS
25.0000 mg | ORAL_CAPSULE | Freq: Four times a day (QID) | ORAL | Status: DC | PRN
Start: 2020-11-10 — End: 2020-11-12

## 2020-11-10 MED ORDER — WITCH HAZEL-GLYCERIN EX PADS: 1.0000 "application " | MEDICATED_PAD | CUTANEOUS | Status: DC | PRN

## 2020-11-10 MED ORDER — FENTANYL CITRATE (PF) 100 MCG/2ML IJ SOLN: 25.0000 ug | INTRAMUSCULAR | Status: DC | PRN

## 2020-11-10 MED ORDER — ACETAMINOPHEN 10 MG/ML IV SOLN
INTRAVENOUS | Status: AC
  Filled 2020-11-10: qty 100

## 2020-11-10 MED ORDER — BUPIVACAINE IN DEXTROSE 0.75-8.25 % IT SOLN
INTRATHECAL | Status: DC | PRN
  Administered 2020-11-10: 1.7 mL via INTRATHECAL

## 2020-11-10 MED ORDER — KETOROLAC TROMETHAMINE 30 MG/ML IJ SOLN
INTRAMUSCULAR | Status: AC
  Filled 2020-11-10: qty 1

## 2020-11-10 MED ORDER — NALBUPHINE HCL 10 MG/ML IJ SOLN
5.0000 mg | Freq: Once | INTRAMUSCULAR | Status: AC | PRN
  Filled 2020-11-10: qty 1

## 2020-11-10 MED ORDER — NALBUPHINE HCL 10 MG/ML IJ SOLN
5.0000 mg | Freq: Once | INTRAMUSCULAR | Status: AC | PRN
Start: 2020-11-10 — End: 2020-11-10
  Administered 2020-11-10: 5 mg via INTRAVENOUS

## 2020-11-10 MED ORDER — LABETALOL HCL 5 MG/ML IV SOLN: 80.0000 mg | INTRAVENOUS | Status: DC | PRN

## 2020-11-10 MED ORDER — MEPERIDINE HCL 25 MG/ML IJ SOLN: 6.2500 mg | INTRAMUSCULAR | Status: DC | PRN

## 2020-11-10 MED ORDER — HYDROCODONE-ACETAMINOPHEN 5-325 MG PO TABS
1.0000 | ORAL_TABLET | ORAL | Status: DC | PRN
Start: 2020-11-10 — End: 2020-11-12

## 2020-11-10 MED ORDER — SIMETHICONE 80 MG PO CHEW
80.0000 mg | CHEWABLE_TABLET | Freq: Three times a day (TID) | ORAL | Status: DC
  Administered 2020-11-11 – 2020-11-12 (×4): 80 mg via ORAL
  Filled 2020-11-10 (×4): qty 1

## 2020-11-10 MED ORDER — KETOROLAC TROMETHAMINE 30 MG/ML IJ SOLN
30.0000 mg | Freq: Four times a day (QID) | INTRAMUSCULAR | Status: AC | PRN
  Administered 2020-11-10: 30 mg via INTRAVENOUS

## 2020-11-10 MED ORDER — NALBUPHINE HCL 10 MG/ML IJ SOLN
5.0000 mg | INTRAMUSCULAR | Status: DC | PRN
Start: 2020-11-10 — End: 2020-11-12

## 2020-11-10 MED ORDER — MAGNESIUM SULFATE 40 GM/1000ML IV SOLN
2.0000 g/h | INTRAVENOUS | Status: DC
  Administered 2020-11-11: 08:00:00 2 g/h via INTRAVENOUS
  Filled 2020-11-10: qty 1000

## 2020-11-10 MED ORDER — ACETAMINOPHEN 500 MG PO TABS
1000.0000 mg | ORAL_TABLET | Freq: Four times a day (QID) | ORAL | Status: DC
  Administered 2020-11-11 – 2020-11-12 (×7): 1000 mg via ORAL
  Filled 2020-11-10 (×8): qty 2

## 2020-11-10 MED ORDER — NALOXONE HCL 0.4 MG/ML IJ SOLN: 0.4000 mg | INTRAMUSCULAR | Status: DC | PRN

## 2020-11-10 MED ORDER — FENTANYL CITRATE (PF) 100 MCG/2ML IJ SOLN
INTRAMUSCULAR | Status: AC
  Filled 2020-11-10: qty 2

## 2020-11-10 MED ORDER — AMISULPRIDE (ANTIEMETIC) 5 MG/2ML IV SOLN: 10.0000 mg | Freq: Once | INTRAVENOUS | Status: DC | PRN

## 2020-11-10 MED ORDER — FENTANYL CITRATE (PF) 100 MCG/2ML IJ SOLN
INTRAMUSCULAR | Status: DC | PRN
  Administered 2020-11-10: 15 ug via INTRAVENOUS

## 2020-11-10 MED ORDER — IBUPROFEN 800 MG PO TABS
800.0000 mg | ORAL_TABLET | Freq: Four times a day (QID) | ORAL | Status: DC
  Administered 2020-11-11 – 2020-11-12 (×4): 800 mg via ORAL
  Filled 2020-11-10 (×4): qty 1

## 2020-11-10 MED ORDER — SODIUM CHLORIDE 0.9 % IV SOLN: INTRAVENOUS | Status: DC | PRN

## 2020-11-10 MED ORDER — ONDANSETRON HCL 4 MG/2ML IJ SOLN
INTRAMUSCULAR | Status: AC
  Filled 2020-11-10: qty 2

## 2020-11-10 MED ORDER — LEVOTHYROXINE SODIUM 25 MCG PO TABS
125.0000 ug | ORAL_TABLET | Freq: Every day | ORAL | Status: DC
  Administered 2020-11-11 – 2020-11-12 (×2): 125 ug via ORAL
  Filled 2020-11-10 (×2): qty 5

## 2020-11-10 MED ORDER — SOD CITRATE-CITRIC ACID 500-334 MG/5ML PO SOLN
ORAL | Status: AC
  Filled 2020-11-10: qty 30

## 2020-11-10 MED ORDER — SENNOSIDES-DOCUSATE SODIUM 8.6-50 MG PO TABS
2.0000 | ORAL_TABLET | Freq: Every day | ORAL | Status: DC
  Administered 2020-11-11 – 2020-11-12 (×2): 2 via ORAL
  Filled 2020-11-10 (×2): qty 2

## 2020-11-10 MED ORDER — MAGNESIUM SULFATE BOLUS VIA INFUSION
4.0000 g | Freq: Once | INTRAVENOUS | Status: AC
  Administered 2020-11-10: 15:00:00 4 g via INTRAVENOUS
  Filled 2020-11-10: qty 1000

## 2020-11-10 MED ORDER — SIMETHICONE 80 MG PO CHEW: 80.0000 mg | CHEWABLE_TABLET | ORAL | Status: DC | PRN

## 2020-11-10 MED ORDER — MENTHOL 3 MG MT LOZG: 1.0000 | LOZENGE | OROMUCOSAL | Status: DC | PRN

## 2020-11-10 MED ORDER — PRENATAL MULTIVITAMIN CH
1.0000 | ORAL_TABLET | Freq: Every day | ORAL | Status: DC
  Administered 2020-11-11 – 2020-11-12 (×2): 1 via ORAL
  Filled 2020-11-10 (×2): qty 1

## 2020-11-10 MED ORDER — OXYTOCIN-SODIUM CHLORIDE 30-0.9 UT/500ML-% IV SOLN
INTRAVENOUS | Status: DC | PRN
  Administered 2020-11-10: 30 [IU] via INTRAVENOUS

## 2020-11-10 MED ORDER — KETOROLAC TROMETHAMINE 30 MG/ML IJ SOLN: 30.0000 mg | Freq: Four times a day (QID) | INTRAMUSCULAR | Status: AC | PRN

## 2020-11-10 MED ORDER — OXYTOCIN-SODIUM CHLORIDE 30-0.9 UT/500ML-% IV SOLN: 2.5000 [IU]/h | INTRAVENOUS | Status: AC

## 2020-11-10 MED ORDER — ACETAMINOPHEN 10 MG/ML IV SOLN
1000.0000 mg | Freq: Once | INTRAVENOUS | Status: DC | PRN
  Administered 2020-11-10: 1000 mg via INTRAVENOUS

## 2020-11-10 MED ORDER — NALOXONE HCL 4 MG/10ML IJ SOLN
1.0000 ug/kg/h | INTRAVENOUS | Status: DC | PRN
  Filled 2020-11-10: qty 5

## 2020-11-10 MED ORDER — ONDANSETRON HCL 4 MG/2ML IJ SOLN
4.0000 mg | Freq: Three times a day (TID) | INTRAMUSCULAR | Status: DC | PRN
Start: 2020-11-10 — End: 2020-11-12

## 2020-11-10 MED ORDER — ONDANSETRON HCL 4 MG/2ML IJ SOLN
INTRAMUSCULAR | Status: DC | PRN
  Administered 2020-11-10: 4 mg via INTRAVENOUS

## 2020-11-10 SURGICAL SUPPLY — 38 items
APL SKNCLS STERI-STRIP NONHPOA (GAUZE/BANDAGES/DRESSINGS) ×1
BENZOIN TINCTURE PRP APPL 2/3 (GAUZE/BANDAGES/DRESSINGS) ×3 IMPLANT
CHLORAPREP W/TINT 26ML (MISCELLANEOUS) ×3 IMPLANT
CLAMP CORD UMBIL (MISCELLANEOUS) IMPLANT
CLOSURE STERI STRIP 1/2 X4 (GAUZE/BANDAGES/DRESSINGS) ×3 IMPLANT
CLOTH BEACON ORANGE TIMEOUT ST (SAFETY) ×3 IMPLANT
DRAPE HALF SHEET 40X57 (DRAPES) ×3 IMPLANT
DRSG OPSITE POSTOP 4X10 (GAUZE/BANDAGES/DRESSINGS) ×3 IMPLANT
ELECT REM PT RETURN 9FT ADLT (ELECTROSURGICAL) ×3
ELECTRODE REM PT RTRN 9FT ADLT (ELECTROSURGICAL) ×1 IMPLANT
EXTRACTOR VACUUM KIWI (MISCELLANEOUS) ×3 IMPLANT
GLOVE BIOGEL PI IND STRL 6.5 (GLOVE) ×1 IMPLANT
GLOVE BIOGEL PI IND STRL 7.0 (GLOVE) ×2 IMPLANT
GLOVE BIOGEL PI INDICATOR 6.5 (GLOVE) ×2
GLOVE BIOGEL PI INDICATOR 7.0 (GLOVE) ×4
GLOVE ECLIPSE 6.0 STRL STRAW (GLOVE) ×3 IMPLANT
GOWN STRL REUS W/TWL LRG LVL3 (GOWN DISPOSABLE) ×6 IMPLANT
KIT ABG SYR 3ML LUER SLIP (SYRINGE) IMPLANT
NEEDLE HYPO 25X5/8 SAFETYGLIDE (NEEDLE) IMPLANT
NS IRRIG 1000ML POUR BTL (IV SOLUTION) ×3 IMPLANT
PACK C SECTION WH (CUSTOM PROCEDURE TRAY) ×3 IMPLANT
PAD OB MATERNITY 4.3X12.25 (PERSONAL CARE ITEMS) ×3 IMPLANT
PENCIL SMOKE EVAC W/HOLSTER (ELECTROSURGICAL) ×3 IMPLANT
RETRACTOR WND ALEXIS 25 LRG (MISCELLANEOUS) IMPLANT
RTRCTR WOUND ALEXIS 25CM LRG (MISCELLANEOUS)
STRIP CLOSURE SKIN 1/2X4 (GAUZE/BANDAGES/DRESSINGS) ×2 IMPLANT
SUT MNCRL 0 VIOLET CTX 36 (SUTURE) ×2 IMPLANT
SUT MONOCRYL 0 CTX 36 (SUTURE) ×4
SUT PLAIN 2 0 XLH (SUTURE) IMPLANT
SUT VIC AB 0 CT1 36 (SUTURE) ×6 IMPLANT
SUT VIC AB 2-0 CT1 27 (SUTURE) ×3
SUT VIC AB 2-0 CT1 TAPERPNT 27 (SUTURE) ×1 IMPLANT
SUT VIC AB 3-0 CT1 27 (SUTURE)
SUT VIC AB 3-0 CT1 TAPERPNT 27 (SUTURE) IMPLANT
SUT VIC AB 4-0 PS2 27 (SUTURE) ×3 IMPLANT
TOWEL OR 17X24 6PK STRL BLUE (TOWEL DISPOSABLE) ×3 IMPLANT
TRAY FOLEY W/BAG SLVR 14FR LF (SET/KITS/TRAYS/PACK) IMPLANT
WATER STERILE IRR 1000ML POUR (IV SOLUTION) ×3 IMPLANT

## 2020-11-10 NOTE — Lactation Note (Addendum)
This note was copied from a baby's chart. Lactation Consultation Note  Patient Name: Samantha Meyer [id number] Date: 11/10/2020 Reason for consult: Initial assessment;Early term 37-38.6wks;Other (Comment) (Hypoglycemic) P2. 4 hour ETI female infant with hyperthermia and hypoglycemic was given 14 mls of Similac Neosure 22 kcal with iron, infant will be taken to [facility] due low temps.  Mom's hx: AMA, hyper thyroidism on L2- safe with breastfeeding on  synthroid meds, CHTN with pre-eclampsia.  Per mom, she has Spectra 2 DEBP at home.  Per mom, she BF her 10 year old son for almost 1 month. Per mom, infant latched once for 5 minutes. Infant is currently doing STS with dad, while mom is using the DEBP.  LC unable assess latch due infant receiving formula due hypoglycemia. LC discussed LPTI feeding policy ( green sheet given) , mom knows to BF infant according to cues, 8 to 12+ times with 24 hrs, STS, not make infant wait to BF, limit feedings total of 30 minutes with breastfeeding and supplementation. Mom knows to use the DEBP every 3 hours for 15 minutes on initial setting. Mom was fitted with size 24 mm breast flange on her right breast and size 27 mm breast flange on her left breast.  Mom shown how to use DEBP & how to disassemble, clean, & reassemble parts. LC suggested mom attend [facility]  ( free) after hospital discharge within the local community.  Mom's current  plan: 1- BF infant according to cues, 8 to 12+ times within 24 hours, STS, mom will  follow LPTI feeding policy. 2- After latching nfant at the breast infant will be supplemented with any EBM first that is expressed  and then 22kcal formula Day 1:  (5-10 mls) per feeding. 3. Mom will use the DEBP every 3 hours for 15 minutes on initial setting. 4. Mom will ask RN or LC for assistance with latching infant at the breast if needed.   Maternal Data Formula Feeding for Exclusion: No Has patient  been taught Hand Expression?: Yes Does the patient have breastfeeding experience prior to this delivery?: Yes  Feeding Feeding Type: Bottle Fed - Formula  LATCH Score Latch: Too sleepy or reluctant, no latch achieved, no sucking elicited.  Audible Swallowing: None  Type of Nipple: Everted at rest and after stimulation  Comfort (Breast/Nipple): Soft / non-tender  Hold (Positioning): Assistance needed to correctly position infant at breast and maintain latch.  LATCH Score: 5  Interventions Interventions: Breast feeding basics reviewed;Skin to skin;Breast massage;Hand express;Expressed milk;DEBP;Hand pump  Lactation Tools Discussed/Used WIC Program: No Pump Review: Setup, frequency, and cleaning;Milk Storage Initiated by:: Danelle Earthly, IBCLC Date initiated:: 11/10/20   Consult Status Consult Status: Follow-up Date: 11/11/20 Follow-up type: In-patient    Danelle Earthly 11/10/2020, 6:22 PM

## 2020-11-10 NOTE — Transfer of Care (Signed)
Immediate Anesthesia Transfer of Care Note  Patient: Samantha Meyer  Procedure(s) Performed: CESAREAN SECTION (N/A )  Patient Location: PACU  Anesthesia Type:Spinal  Level of Consciousness: awake, alert  and oriented  Airway & Oxygen Therapy: Patient Spontanous Breathing  Post-op Assessment: Report given to RN  Post vital signs: Reviewed and stable  Last Vitals:  Vitals Value Taken Time  BP 137/82 11/10/20 1457  Temp    Pulse 87 11/10/20 1459  Resp 22 11/10/20 1459  SpO2 99 % 11/10/20 1459  Vitals shown include unvalidated device data.  Last Pain:  Vitals:   11/10/20 1218  TempSrc: Oral         Complications: No complications documented.

## 2020-11-10 NOTE — H&P (Addendum)
Samantha Meyer is a 34 y.o. G3P1011 at [redacted]w[redacted]d gestation presents for repeat c/s.  H/o ltcs, wants repeat. CHTH with super imposed mild pre-eclampsia (labetalol 200mg  tid), severe IUGR with efw 3% requiring delivery at 37wga. Elevated s/d ratio 2 days ago, no aedf.  The patient denies h/a, vision changes, ruq pain.   Antepartum course: ama, hypothyroid controlled on synthroid, MCI, h/o mitral valve repair  [facility] at [facility] 10 wks.  See complete pre-natal records  History OB History    Gravida  3   Para  1   Term  1   Preterm      AB  1   Living  1     SAB  1   IAB      Ectopic      Multiple      Live Births             Past Medical History:  Diagnosis Date  . Hirsutism   . Hypertension   . Hypothyroidism   . Mitral incompetence   . Mitral valve prolapse   . Pregnancy induced hypertension    Past Surgical History:  Procedure Laterality Date  . CESAREAN SECTION    . DILATION AND CURETTAGE OF UTERUS    . MITRAL VALVE REPAIR  01/29/12  . TOTAL THYROIDECTOMY     Family History: family history includes Cancer in her father and mother; Heart disease in her maternal grandmother, mother, and paternal grandmother; Hypertension in her maternal grandfather, maternal grandmother, paternal grandfather, and paternal grandmother. Social History:  reports that she has never smoked. She has never used smokeless tobacco. She reports that she does not drink alcohol and does not use drugs.  ROS: See above otherwise negative  Prenatal labs:  ABO, Rh: --/--/O POS (12/20 1556) Antibody: NEG (12/20 1556) Rubella:  Immune RPR: NON REACTIVE (12/20 1556)  HBsAg:   NR HIV:  NR GBS:   positive 1 hr Glucola: Normal Genetic screening: Normal Anatomy 05-27-1970: Normal; mci  Physical Exam:     Height 5\' 6"  (1.676 m), weight 83.9 kg, last menstrual period 02/25/2020. A&O x 3 HEENT: Normal Lungs: CTAB CV: RRR Abdominal: Soft, Non-tender and Gravid Lower  Extremities: Non-edematous, Non-tender  Pelvic Exam: deferred  Labs:  CBC:  Lab Results  Component Value Date   WBC 7.7 11/08/2020   RBC 3.23 (L) 11/08/2020   HGB 11.2 (L) 11/08/2020   HCT 32.4 (L) 11/08/2020   MCV 100.3 (H) 11/08/2020   MCH 34.7 (H) 11/08/2020   MCHC 34.6 11/08/2020   RDW 13.0 11/08/2020   PLT 241 11/08/2020   CMP:  Lab Results  Component Value Date   NA 136 02/06/2012   K 4.6 02/06/2012   CL 99 02/06/2012   CO2 28 02/06/2012   GLUCOSE 104 (H) 02/06/2012   BUN 12 02/06/2012   CREATININE 0.91 02/06/2012   CALCIUM 9.8 02/06/2012   PROT 5.7 (L) 06/20/2008   AST 26 06/20/2008   ALT 13 06/20/2008   ALBUMIN 2.4 (L) 06/20/2008   ALKPHOS 112 06/20/2008   BILITOT 1.1 06/20/2008   GFRNONAA 86 (L) 02/06/2012   GFRAA >90 02/06/2012   Urine: Lab Results  Component Value Date   COLORURINE YELLOW 02/01/2008   APPEARANCEUR CLEAR 02/01/2008   LABSPEC 1.010 02/01/2008   PHURINE 6.5 02/01/2008   GLUCOSEU NEGATIVE 02/01/2008   HGBUR NEGATIVE 02/01/2008   BILIRUBINUR NEGATIVE 02/01/2008   KETONESUR NEGATIVE 02/01/2008   PROTEINUR NEGATIVE 02/01/2008   NITRITE NEGATIVE  02/01/2008   LEUKOCYTESUR  02/01/2008    NEGATIVE MICROSCOPIC NOT DONE ON URINES WITH NEGATIVE PROTEIN, BLOOD, LEUKOCYTES, NITRITE, OR GLUCOSE <1000 mg/dL.     Prenatal Transfer Tool  Maternal Diabetes: No Genetic Screening: Normal Maternal Ultrasounds/Referrals: IUGR Fetal Ultrasounds or other Referrals:  Fetal echo, Referred to Materal Fetal Medicine  Maternal Substance Abuse:  No Significant Maternal Medications:  Meds include: Syntroid Other:  labetalol Significant Maternal Lab Results: Group B Strep positive    Assessment/Plan:  34 y.o. G3P1011 at [redacted]w[redacted]d gestation   1. CHTN with superimposed mild pre-eclampsia with h/o prior c/s - pt chooses to proceed with repeat c/s. Risk of procedure reivewed including bleeding (possible need for transfusion, infection, injury to  bowel/bladder/nerves/blood vessels, risk of further surgery, risk of anesthesia, risk of blood clot to legs/lungs.  Consent signed.  Proceed to OR.  Will check CMP now, plan magnesium sulfate x24 hrs if severe pre-e by labs 2. Hypothyroid - synthroid, controlled in pregnancy 3. ama - nml nipt 4. H/o mvp repair, ef 45-50% 5. IUGR, severe 6. MCI 7. gbs positive  8.  Anemia  Vick Frees 11/10/2020, 12:48 PM

## 2020-11-10 NOTE — Op Note (Signed)
Cesarean Section Procedure Note   Samantha Meyer  11/10/2020  Indications: mild pre-eclampsia, severe iugr, h/o ltcs   Pre-operative Diagnosis: 37 wga, prev c/s, IUGR, CHTN with superimposed mild pre-eclampsia, ama, hypothyroid, mvp with h/o repair   Post-operative Diagnosis: Same, superimposed severe pre-eclampsia (lft now back and elevated)  Surgeon: Surgeon(s) and Role:    * Blane Worthington, Candace Gallus, MD - Primary   Assistants: Dorisann Frames, CNM  Anesthesia: spinal   Procedure Details:  The patient was seen in the Holding Room. The risks, benefits, complications, treatment options, and expected outcomes were discussed with the patient. The patient concurred with the proposed plan, giving informed consent. identified as Samantha Meyer and the procedure verified as C-Section Delivery. A Time Out was held and the above information confirmed.  After induction of anesthesia, the patient was draped and prepped in the usual sterile manner, foley was draining urine well.  A pfannenstiel incision was made and carried down through the subcutaneous tissue to the fascia. Fascial incision was made and extended transversely. The fascia was separated from the underlying rectus tissue superiorly and inferiorly. The peritoneum was identified and entered. Peritoneal incision was extended longitudinally. Alexis-O retractor placed. The utero-vesical peritoneal reflection was incised transversely and the bladder flap was bluntly freed from the lower uterine segment. A thin LUS was noted, no window.  A low transverse uterine incision was made. Delivered from cephalic presentation was a viable female infant with vigorous cry. Apgar scores of 8 at one minute and 9 at five minutes. Delayed cord clamping done at 1 minute and baby handed to NICU team in attendance. Cord ph was not sent. Cord blood was obtained for evaluation. The placenta was removed Intact and appeared normal. The uterine outline, tubes and ovaries were  difficult to visualize d/t bowel but tubes wnl bilat with small simple paratubal cyst right tube, 1cm. The uterine incision was closed with running locked sutures of 0 monocryl. A second imbricating layer sutured.   Hemostasis was observed. Alexis retractor removed. Peritoneal closure done with 2-0 Vicryl.  The fascia was then reapproximated with running sutures of 0Vicryl. The subcuticular closure was performed using 2-0 vicryl. The skin was closed with 4-0Vicryl.   Instrument, sponge, and needle counts were correct prior the abdominal closure and were correct at the conclusion of the case.    Findings: viable female infant, apgars 8/9; normal appearing uterus, bilat tubs wnl with small simple paratubal cyst rt tube; ov not well visualized; elevated LFT resulted after surgery completion   Estimated Blood Loss: 262 mL   Total IV Fluids:   Urine Output: OF clear urine  Specimens: pathology   Complications: no complications  Disposition: PACU - hemodynamically stable.   Maternal Condition: stable   Baby condition / location:  Couplet care / Skin to Skin  Attending Attestation: I performed the procedure.   Signed: Surgeon(s): Amado Nash Candace Gallus, MD

## 2020-11-10 NOTE — Anesthesia Preprocedure Evaluation (Addendum)
Anesthesia Evaluation  Patient identified by MRN, date of birth, ID band Patient awake    Reviewed: Allergy & Precautions, H&P , NPO status , Patient's Chart, lab work & pertinent test results  Airway Mallampati: II  TM Distance: >3 FB Neck ROM: Full    Dental no notable dental hx.    Pulmonary neg pulmonary ROS,    Pulmonary exam normal breath sounds clear to auscultation       Cardiovascular Exercise Tolerance: Good hypertension, Normal cardiovascular exam+ Valvular Problems/Murmurs (s/p repair) MVP  Rhythm:Regular Rate:Normal     Neuro/Psych negative neurological ROS  negative psych ROS   GI/Hepatic negative GI ROS, Neg liver ROS,   Endo/Other  Hypothyroidism   Renal/GU negative Renal ROS  negative genitourinary   Musculoskeletal negative musculoskeletal ROS (+)   Abdominal   Peds negative pediatric ROS (+)  Hematology  (+) anemia ,   Anesthesia Other Findings   Reproductive/Obstetrics negative OB ROS                            Anesthesia Physical Anesthesia Plan  ASA: II  Anesthesia Plan: Spinal   Post-op Pain Management:    Induction:   PONV Risk Score and Plan: 2 and Scopolamine patch - Pre-op, Ondansetron, Dexamethasone and Treatment may vary due to age or medical condition  Airway Management Planned: Natural Airway  Additional Equipment:   Intra-op Plan:   Post-operative Plan:   Informed Consent: I have reviewed the patients History and Physical, chart, labs and discussed the procedure including the risks, benefits and alternatives for the proposed anesthesia with the patient or authorized representative who has indicated his/her understanding and acceptance.       Plan Discussed with: CRNA and Anesthesiologist  Anesthesia Plan Comments:         Anesthesia Quick Evaluation

## 2020-11-10 NOTE — Progress Notes (Signed)
Labs reviewed CMP Latest Ref Rng & Units 11/10/2020 02/06/2012 06/20/2008  Glucose 70 - 99 mg/dL 638(T) 771(H) 657(X)  BUN 6 - 20 mg/dL 11 12 2(L)  Creatinine 0.44 - 1.00 mg/dL 0.38 3.33 8.32  Sodium 135 - 145 mmol/L 138 136 135  Potassium 3.5 - 5.1 mmol/L 4.3 4.6 3.5  Chloride 98 - 111 mmol/L 107 99 104  CO2 22 - 32 mmol/L 19(L) 28 21  Calcium 8.9 - 10.3 mg/dL 9.2 9.8 8.6  Total Protein 6.5 - 8.1 g/dL 6.1(L) - 5.7(L)  Total Bilirubin 0.3 - 1.2 mg/dL 0.9 - 1.1  Alkaline Phos 38 - 126 U/L 85 - 112  AST 15 - 41 U/L 85(H) - 26  ALT 0 - 44 U/L 37 - 13    Labs c/w CHTN now with severe pre-eclampsia with elevated ast.  Plan magnesium sulfate x24 hrs and plan repeat labs in am.  See operative note for delivery.

## 2020-11-10 NOTE — Anesthesia Postprocedure Evaluation (Signed)
Anesthesia Post Note  Patient: Samantha Meyer  Procedure(s) Performed: CESAREAN SECTION (N/A )     Patient location during evaluation: PACU Anesthesia Type: Spinal Level of consciousness: oriented and awake and alert Pain management: pain level controlled Vital Signs Assessment: post-procedure vital signs reviewed and stable Respiratory status: spontaneous breathing and respiratory function stable Cardiovascular status: blood pressure returned to baseline and stable Postop Assessment: no headache, no backache, no apparent nausea or vomiting and spinal receding Anesthetic complications: no   No complications documented.  Last Vitals:  Vitals:   11/10/20 1620 11/10/20 1645  BP: 107/60 117/63  Pulse: 88 94  Resp: (!) 22 20  Temp:  36.7 C  SpO2: 97% 99%    Last Pain:  Vitals:   11/10/20 1645  TempSrc: Oral  PainSc:                  [name]

## 2020-11-10 NOTE — Anesthesia Procedure Notes (Signed)
Spinal  Patient location during procedure: OR Start time: 11/10/2020 1:27 PM End time: 11/10/2020 1:32 PM Staffing Performed: anesthesiologist  Anesthesiologist: [name], MD Preanesthetic Checklist Completed: patient identified, IV checked, risks and benefits discussed, surgical consent, monitors and equipment checked, pre-op evaluation and timeout performed Spinal Block Patient position: sitting Prep: DuraPrep Patient monitoring: cardiac monitor, continuous pulse ox and blood pressure Approach: midline Location: L3-4 Injection technique: single-shot Needle Needle type: Pencan  Needle gauge: 24 G Needle length: 9 cm Additional Notes Functioning IV was confirmed and monitors were applied. Sterile prep and drape, including hand hygiene and sterile gloves were used. The patient was positioned and the spine was prepped. The skin was anesthetized with lidocaine.  Free flow of clear CSF was obtained prior to injecting local anesthetic into the CSF.  The spinal needle aspirated freely following injection.  The needle was carefully withdrawn.  The patient tolerated the procedure well.

## 2020-11-11 ENCOUNTER — Encounter (HOSPITAL_COMMUNITY): Payer: Self-pay | Admitting: Obstetrics and Gynecology

## 2020-11-11 LAB — COMPREHENSIVE METABOLIC PANEL
ALT: 30 U/L (ref 0–44)
AST: 57 U/L — ABNORMAL HIGH (ref 15–41)
Albumin: 2.3 g/dL — ABNORMAL LOW (ref 3.5–5.0)
Alkaline Phosphatase: 68 U/L (ref 38–126)
Anion gap: 8 (ref 5–15)
BUN: 7 mg/dL (ref 6–20)
CO2: 23 mmol/L (ref 22–32)
Calcium: 7.2 mg/dL — ABNORMAL LOW (ref 8.9–10.3)
Chloride: 102 mmol/L (ref 98–111)
Creatinine, Ser: 0.8 mg/dL (ref 0.44–1.00)
GFR, Estimated: >60 mL/min (ref >60)
Glucose, Bld: 128 mg/dL — ABNORMAL HIGH (ref 70–99)
Potassium: 4 mmol/L (ref 3.5–5.1)
Sodium: 133 mmol/L — ABNORMAL LOW (ref 135–145)
Total Bilirubin: 0.5 mg/dL (ref 0.3–1.2)
Total Protein: 5 g/dL — ABNORMAL LOW (ref 6.5–8.1)

## 2020-11-11 LAB — CBC
HCT: 27 % — ABNORMAL LOW (ref 36.0–46.0)
Hemoglobin: 9.4 g/dL — ABNORMAL LOW (ref 12.0–15.0)
MCH: 35.1 pg — ABNORMAL HIGH (ref 26.0–34.0)
MCHC: 34.8 g/dL (ref 30.0–36.0)
MCV: 100.7 fL — ABNORMAL HIGH (ref 80.0–100.0)
Platelets: 222 10*3/uL (ref 150–400)
RBC: 2.68 MIL/uL — ABNORMAL LOW (ref 3.87–5.11)
RDW: 13.2 % (ref 11.5–15.5)
WBC: 15.4 10*3/uL — ABNORMAL HIGH (ref 4.0–10.5)
nRBC: 0.2 % (ref 0.0–0.2)

## 2020-11-11 LAB — MAGNESIUM: Magnesium: 6.4 mg/dL (ref 1.7–2.4)

## 2020-11-11 MED ORDER — POLYSACCHARIDE IRON COMPLEX 150 MG PO CAPS
150.0000 mg | ORAL_CAPSULE | Freq: Every day | ORAL | Status: DC
  Administered 2020-11-12: 11:00:00 150 mg via ORAL
  Filled 2020-11-11: qty 1

## 2020-11-11 NOTE — Progress Notes (Signed)
On unit, nurse informed me that Samantha Meyer feeling very tired  Pt c/o gradually increasing fatigue, hard to get up to void with assistance, feels lightheaded/dizzy  Temp:  [97.5 F (36.4 C)-98.4 F (36.9 C)] 98.2 F (36.8 C) (12/23 1136) Pulse Rate:  [67-95] 67 (12/23 1136) Resp:  [16-26] 16 (12/23 1136) BP: (102-130)/(58-87) 130/80 (12/23 1136) SpO2:  [94 %-100 %] 98 % (12/23 1136)   Results for orders placed or performed during the hospital encounter of 11/10/20 (from the past 24 hour(s))  Comprehensive metabolic panel     Status: Abnormal   Collection Time: 11/10/20  1:19 PM  Result Value Ref Range   Sodium 138 135 - 145 mmol/L   Potassium 4.3 3.5 - 5.1 mmol/L   Chloride 107 98 - 111 mmol/L   CO2 19 (L) 22 - 32 mmol/L   Glucose, Bld 103 (H) 70 - 99 mg/dL   BUN 11 6 - 20 mg/dL   Creatinine, Ser 6.27 0.44 - 1.00 mg/dL   Calcium 9.2 8.9 - 03.5 mg/dL   Total Protein 6.1 (L) 6.5 - 8.1 g/dL   Albumin 2.9 (L) 3.5 - 5.0 g/dL   AST 85 (H) 15 - 41 U/L   ALT 37 0 - 44 U/L   Alkaline Phosphatase 85 38 - 126 U/L   Total Bilirubin 0.9 0.3 - 1.2 mg/dL   GFR, Estimated >00 >93 mL/min   Anion gap 12 5 - 15  Uric acid     Status: None   Collection Time: 11/10/20  1:19 PM  Result Value Ref Range   Uric Acid, Serum 6.3 2.5 - 7.1 mg/dL  CBC     Status: Abnormal   Collection Time: 11/11/20  5:36 AM  Result Value Ref Range   WBC 15.4 (H) 4.0 - 10.5 K/uL   RBC 2.68 (L) 3.87 - 5.11 MIL/uL   Hemoglobin 9.4 (L) 12.0 - 15.0 g/dL   HCT 81.8 (L) 29.9 - 37.1 %   MCV 100.7 (H) 80.0 - 100.0 fL   MCH 35.1 (H) 26.0 - 34.0 pg   MCHC 34.8 30.0 - 36.0 g/dL   RDW 69.6 78.9 - 38.1 %   Platelets 222 150 - 400 K/uL   nRBC 0.2 0.0 - 0.2 %  Comprehensive metabolic panel     Status: Abnormal   Collection Time: 11/11/20  5:36 AM  Result Value Ref Range   Sodium 133 (L) 135 - 145 mmol/L   Potassium 4.0 3.5 - 5.1 mmol/L   Chloride 102 98 - 111 mmol/L   CO2 23 22 - 32 mmol/L   Glucose, Bld 128 (H) 70 - 99  mg/dL   BUN 7 6 - 20 mg/dL   Creatinine, Ser 0.17 0.44 - 1.00 mg/dL   Calcium 7.2 (L) 8.9 - 10.3 mg/dL   Total Protein 5.0 (L) 6.5 - 8.1 g/dL   Albumin 2.3 (L) 3.5 - 5.0 g/dL   AST 57 (H) 15 - 41 U/L   ALT 30 0 - 44 U/L   Alkaline Phosphatase 68 38 - 126 U/L   Total Bilirubin 0.5 0.3 - 1.2 mg/dL   GFR, Estimated >51 >02 mL/min   Anion gap 8 5 - 15  Magnesium     Status: Abnormal   Collection Time: 11/11/20  5:36 AM  Result Value Ref Range   Magnesium 6.4 (HH) 1.7 - 2.4 mg/dL   A&ox3 rrr ctab Abd: soft, nt, nd LE: no edema, nt bilat 2+ dtr  A/P D/c magnesium sulfate  now, has been about 22 hr; low normal bps and at low risk for seizure; no magnesium toxicity; anticipate sx to resolve after magnesium stopped but will monitor closely; pt is anemic but not with significant drop; plan sooner cbc if sx persist

## 2020-11-11 NOTE — Progress Notes (Signed)
Pt feeling lethargic and dizzy. Dr. Amado Nash aware. Order in for magnesium level to be drawn and instructed to discontinue the magnesium. Will continue to monitor. Carmelina Dane, RN

## 2020-11-11 NOTE — Plan of Care (Signed)
  Problem: Education: Goal: Knowledge of General Education information will improve Description: Including pain rating scale, medication(s)/side effects and non-pharmacologic comfort measures Outcome: Completed/Met   Problem: Coping: Goal: Level of anxiety will decrease Outcome: Completed/Met   Problem: Coping: Goal: Ability to identify and utilize available resources and services will improve Outcome: Completed/Met   Problem: Role Relationship: Goal: Ability to demonstrate positive interaction with newborn will improve Outcome: Completed/Met   Problem: Skin Integrity: Goal: Demonstration of wound healing without infection will improve Outcome: Completed/Met   Problem: Education: Goal: Knowledge of disease or condition will improve Outcome: Completed/Met

## 2020-11-11 NOTE — Progress Notes (Addendum)
POSTOPERATIVE DAY # 2 S/P Repeat LTCS for CHTN with superimposed mild pre-eclampsia and IUGR, baby boy "Khari"   S:         Reports feeling tired, itchy, and sore. Rates pain 3-4/10, controlled with Tylenol and Toradol             Tolerating po intake / no nausea / no vomiting / no flatus / no BM  Denies HA, visual changes, RUQ/epigastric pain.  Denies SOB, or CP             Bleeding is light            O:  VS: BP 118/60 (BP Location: Right Arm)   Pulse 92   Temp 97.7 F (36.5 C) (Oral)   Resp 16   Ht 5\' 6"  (1.676 m)   Wt 83.9 kg   LMP 02/25/2020   SpO2 98%   Breastfeeding Unknown   BMI 29.86 kg/m  Patient Vitals for the past 24 hrs:  BP Temp Temp src Pulse Resp SpO2 Height Weight  11/11/20 0653 -- -- -- -- 16 -- -- --  11/11/20 0610 -- -- -- -- 16 -- -- --  11/11/20 0500 -- -- -- -- 16 -- -- --  11/11/20 0345 118/60 97.7 F (36.5 C) Oral -- 16 98 % -- --  11/11/20 0253 -- -- -- -- 16 98 % -- --  11/11/20 0200 -- -- -- -- 16 -- -- --  11/11/20 0100 -- -- -- -- 18 96 % -- --  11/10/20 2348 -- 98.1 F (36.7 C) Oral -- 18 96 % -- --  11/10/20 2300 117/67 -- -- 92 18 95 % -- --  11/10/20 2200 121/69 -- -- 78 18 98 % -- --  11/10/20 2100 120/77 -- -- 83 18 98 % -- --  11/10/20 1932 (!) 102/58 97.7 F (36.5 C) Oral 70 18 99 % -- --  11/10/20 1900 -- -- -- 74 -- 99 % -- --  11/10/20 1759 109/79 (!) 97.5 F (36.4 C) -- 84 -- 97 % -- --  11/10/20 1645 117/63 98 F (36.7 C) Oral 94 20 99 % -- --  11/10/20 1620 107/60 -- -- 88 (!) 22 97 % -- --  11/10/20 1610 128/82 -- -- 89 18 98 % -- --  11/10/20 1600 116/80 -- -- 91 18 96 % -- --  11/10/20 1550 119/78 98.4 F (36.9 C) Oral 91 (!) 23 96 % -- --  11/10/20 1540 119/76 -- -- 93 (!) 22 99 % -- --  11/10/20 1530 123/79 -- -- 85 (!) 23 100 % -- --  11/10/20 1515 128/69 -- -- 93 (!) 26 100 % -- --  11/10/20 1500 124/87 97.8 F (36.6 C) Oral 95 20 100 % -- --  11/10/20 1218 -- -- Oral -- -- -- 5\' 6"  (1.676 m) 83.9 kg    LABS:   Results for orders placed or performed during the hospital encounter of 11/10/20 (from the past 24 hour(s))  Comprehensive metabolic panel     Status: Abnormal   Collection Time: 11/10/20  1:19 PM  Result Value Ref Range   Sodium 138 135 - 145 mmol/L   Potassium 4.3 3.5 - 5.1 mmol/L   Chloride 107 98 - 111 mmol/L   CO2 19 (L) 22 - 32 mmol/L   Glucose, Bld 103 (H) 70 - 99 mg/dL   BUN 11 6 - 20 mg/dL   Creatinine, Ser 11/12/20 0.44 - 1.00  mg/dL   Calcium 9.2 8.9 - 45.8 mg/dL   Total Protein 6.1 (L) 6.5 - 8.1 g/dL   Albumin 2.9 (L) 3.5 - 5.0 g/dL   AST 85 (H) 15 - 41 U/L   ALT 37 0 - 44 U/L   Alkaline Phosphatase 85 38 - 126 U/L   Total Bilirubin 0.9 0.3 - 1.2 mg/dL   GFR, Estimated >09 >98 mL/min   Anion gap 12 5 - 15  Uric acid     Status: None   Collection Time: 11/10/20  1:19 PM  Result Value Ref Range   Uric Acid, Serum 6.3 2.5 - 7.1 mg/dL  CBC     Status: Abnormal   Collection Time: 11/11/20  5:36 AM  Result Value Ref Range   WBC 15.4 (H) 4.0 - 10.5 K/uL   RBC 2.68 (L) 3.87 - 5.11 MIL/uL   Hemoglobin 9.4 (L) 12.0 - 15.0 g/dL   HCT 33.8 (L) 25.0 - 53.9 %   MCV 100.7 (H) 80.0 - 100.0 fL   MCH 35.1 (H) 26.0 - 34.0 pg   MCHC 34.8 30.0 - 36.0 g/dL   RDW 76.7 34.1 - 93.7 %   Platelets 222 150 - 400 K/uL   nRBC 0.2 0.0 - 0.2 %  Comprehensive metabolic panel     Status: Abnormal   Collection Time: 11/11/20  5:36 AM  Result Value Ref Range   Sodium 133 (L) 135 - 145 mmol/L   Potassium 4.0 3.5 - 5.1 mmol/L   Chloride 102 98 - 111 mmol/L   CO2 23 22 - 32 mmol/L   Glucose, Bld 128 (H) 70 - 99 mg/dL   BUN 7 6 - 20 mg/dL   Creatinine, Ser 9.02 0.44 - 1.00 mg/dL   Calcium 7.2 (L) 8.9 - 10.3 mg/dL   Total Protein 5.0 (L) 6.5 - 8.1 g/dL   Albumin 2.3 (L) 3.5 - 5.0 g/dL   AST 57 (H) 15 - 41 U/L   ALT 30 0 - 44 U/L   Alkaline Phosphatase 68 38 - 126 U/L   Total Bilirubin 0.5 0.3 - 1.2 mg/dL   GFR, Estimated >40 >97 mL/min   Anion gap 8 5 - 15               Bloodtype: --/--/O  POS (12/20 1556)  Rubella: Immune (06/17 0000)                                             I&O: Intake/Output      12/22 0701 12/23 0700 12/23 0701 12/24 0700   P.O. 2320    I.V. (mL/kg) 3407.8 (40.6)    IV Piggyback 100    Total Intake(mL/kg) 5827.8 (69.5)    Urine (mL/kg/hr) 2475    Blood 287    Total Output 2762    Net +3065.8                      Physical Exam:             Alert and Oriented X3  Lungs: Clear and unlabored  Heart: regular rate and rhythm / no murmurs  Abdomen: soft, appropriately tender, moderate gaseous distention, active bowel sounds in all quadrants              Fundus: firm, non-tender, below umbilicus  Dressing: >50% saturated with sanguinous drainage             Incision:  approximated with sutures / no erythema / no ecchymosis / no drainage  Perineum: intact  Lochia: scant rubra   Extremities: trace LE edema, no calf pain or tenderness, SCDs on, +2 DTRs, no clonus bilaterally  A/P:     POD # 2 S/P Repeat LTCS  CHTN with superimposed mild pre-eclampsia    - DC home no meds fu office  One week            ABL Anemia compounding chronic IDA   - Begin oral FE   Hypothyroidism   - on Synthroid daily   - F/u with Dr. Elvera Lennox PP  Hx. Of MVP with repair  Routine postoperative care              Change honeycomb dressing   DC home

## 2020-11-11 NOTE — Lactation Note (Signed)
This note was copied from a baby's chart. Lactation Consultation Note  Patient Name: Samantha Meyer [id number] Date: 11/11/2020   Infant is 37 weeks 22 hours old. Mom states latch is a bit painful on the left side since her breast was a little larger. LC examined both breasts and nipples are erect, round and no signs of trauma.   Mom is offering breasts first latching infant for 7 minutes followed by Similac Neosure 22 cal/ oz 14 ml at 9:30 am. . LC reviewed paced bottle feeding with Mom with slow flow nipple. LC also reviewed behaviors of LPTI to reduce calorie loss.   Mom denies use of a pacifier. Mom is pumping q 3hours for 15 minutes but has not pumped today. She denies any pain with pumping.   LC did breast massage and demonstrated hand expression with Mom. No drops of colostrum noted. Mom set up on DEBP and pre pumped for 5 minutes. Beads of colostrum noted and infant latched in football on the left side for 6 minutes still nursing at the end of the visit. Mom denied any pain with the latch. Infant showed signs of milk transfer with breast compression.   Plan 1. To feed based on cues 8-12 in 24 hour period no more than 3 hours without an attempt. Mom to offer breasts first.       2. Mom to pre pump for 5 minutes or breast massage and hand express before latching.  3. Mom to paced bottle feed infant Similac with Iron based on  LPTI guidelines for supplementation. 4. Mom to pump using DEBP q 3 hours for 15 minutes.   Jin Shockley  Nicholson-Springer 11/11/2020, 12:33 PM

## 2020-11-12 LAB — COMPREHENSIVE METABOLIC PANEL
ALT: 25 U/L (ref 0–44)
AST: 36 U/L (ref 15–41)
Albumin: 2.1 g/dL — ABNORMAL LOW (ref 3.5–5.0)
Alkaline Phosphatase: 63 U/L (ref 38–126)
Anion gap: 5 (ref 5–15)
BUN: 12 mg/dL (ref 6–20)
CO2: 23 mmol/L (ref 22–32)
Calcium: 7.4 mg/dL — ABNORMAL LOW (ref 8.9–10.3)
Chloride: 105 mmol/L (ref 98–111)
Creatinine, Ser: 0.84 mg/dL (ref 0.44–1.00)
GFR, Estimated: >60 mL/min (ref >60)
Glucose, Bld: 95 mg/dL (ref 70–99)
Potassium: 3.9 mmol/L (ref 3.5–5.1)
Sodium: 133 mmol/L — ABNORMAL LOW (ref 135–145)
Total Bilirubin: 0.4 mg/dL (ref 0.3–1.2)
Total Protein: 4.6 g/dL — ABNORMAL LOW (ref 6.5–8.1)

## 2020-11-12 LAB — CBC
HCT: 23.4 % — ABNORMAL LOW (ref 36.0–46.0)
Hemoglobin: 8 g/dL — ABNORMAL LOW (ref 12.0–15.0)
MCH: 34.6 pg — ABNORMAL HIGH (ref 26.0–34.0)
MCHC: 34.2 g/dL (ref 30.0–36.0)
MCV: 101.3 fL — ABNORMAL HIGH (ref 80.0–100.0)
Platelets: 209 10*3/uL (ref 150–400)
RBC: 2.31 MIL/uL — ABNORMAL LOW (ref 3.87–5.11)
RDW: 13.3 % (ref 11.5–15.5)
WBC: 14.1 10*3/uL — ABNORMAL HIGH (ref 4.0–10.5)
nRBC: 0.4 % — ABNORMAL HIGH (ref 0.0–0.2)

## 2020-11-12 MED ORDER — SUCROSE 24% NICU/PEDS ORAL SOLUTION
0.5000 mL | OROMUCOSAL | Status: DC | PRN
  Filled 2020-11-12: qty 1

## 2020-11-12 MED ORDER — LIDOCAINE 1% INJECTION FOR CIRCUMCISION
0.8000 mL | INJECTION | Freq: Once | INTRAVENOUS | Status: DC
  Filled 2020-11-12: qty 1

## 2020-11-12 MED ORDER — ACETAMINOPHEN FOR CIRCUMCISION 160 MG/5 ML: 40.0000 mg | Freq: Once | ORAL | Status: DC

## 2020-11-12 MED ORDER — WHITE PETROLATUM EX OINT
1.0000 | TOPICAL_OINTMENT | CUTANEOUS | Status: DC | PRN
Start: 2020-11-12 — End: 2020-11-12

## 2020-11-12 MED ORDER — ACETAMINOPHEN FOR CIRCUMCISION 160 MG/5 ML: 40.0000 mg | ORAL | Status: DC | PRN

## 2020-11-12 MED ORDER — EPINEPHRINE TOPICAL FOR CIRCUMCISION 0.1 MG/ML: 1.0000 [drp] | TOPICAL | Status: DC | PRN

## 2020-11-12 NOTE — Lactation Note (Signed)
This note was copied from a baby's chart. Lactation Consultation Note  Patient Name: Samantha Meyer [name] [id number] Date: 11/12/2020 Reason for consult: Follow-up assessment;Early term 37-38.6wks;Infant < 6lbs;Maternal endocrine disorder Age:35 hours Baby with 2.48% wt loss. Mom resting in bed, baby out for circumcision. Mom reports breastfeeding is not going so well, states baby nurses for ~62mins at the breast then supplements with formula to complete feeding. Mom reports pumping q3hrs, notes little to no colostrum. Mom reports with a Spectra pump at home, desires to exclusively offer breast milk, reports breast fed older child for 87mo and would like to go much longer. Mom reports with peds appt for Monday but unsure if office has an LC.   Discussed option of hospital grade DEBP upon discharge, review Hands On Pumping video, optimal skin to skin, feed on cue and wake if >3hrs since last feeding, pre pump for stimulation and milk collection, hand expression. Advised baby may be sleepy following circumcision, keep skin to skin and call for [facility] support if needed for latch assistance. Discussed community support groups, [facility] BF brochure, message sent for [facility] outpatient LC appt. Mom voiced understanding and with no further concerns. Left the room with mom resting in bed. BGilliam, RN, IBCLC      Interventions Interventions: Breast feeding basics reviewed;DEBP   Consult Status Consult Status: Follow-up Date: 11/13/20 Follow-up type: In-patient    Charlynn Court 11/12/2020, 10:57 AM

## 2020-11-12 NOTE — Discharge Summary (Signed)
Postpartum Discharge Summary  Date of Service updated12/24     Patient Name: Samantha Meyer DOB: [birth year] MRN: [id number]  Date of admission: 11/10/2020 Delivery date:   Mihira, Tozzi [[id number]]      Kenitra, Leventhal [[id number]]  11/10/2020   Delivering provider:    Laury, Huizar [[id number]]      Minteer, Boy [place] [[id number]]  Tiana Loft E   Date of discharge: 11/12/2020  Admitting diagnosis: Pre-eclampsia [O14.90] Intrauterine pregnancy: [redacted]w[redacted]d    Secondary diagnosis:  Principal Problem:   Postpartum care following cesarean delivery 12/22 Active Problems:   Status post repeat low transverse cesarean section 12/22   Preeclampsia, severe   Chronic hypertension   Hypothyroidism  Additional problems: none    Discharge diagnosis: Term Pregnancy Delivered, Gestational Hypertension, Preeclampsia (mild) and Anemia                                              Post partum procedures:postpartum tubal ligation Augmentation: N/A Complications: None  Hospital course: Sceduled C/S   34y.o. yo [id number] 334w0das admitted to the hospital 11/10/2020 for scheduled cesarean section with the following indication:Elective Repeat.Delivery details are as follows:  Membrane Rupture Time/Date:    JaBeya, Tipps0[[id number]]    JaCasandra, DallaireaPendroy0[[id number]]2:05 PM  ,   JaJoselinne, Lawal0[[id number]]    JaJaydalee, BardwellaBeverly0[[id number]]11/10/2020    Delivery Method:   JaFloreen, Teegarden0[[id number]]    JaAliahna, StatzeraBeaulieu0[[id number]]C-Section, Low Transverse   Details of operation can be found in separate operative note.  Patient had an uncomplicated postpartum course.  She is ambulating, tolerating a regular diet, passing flatus, and urinating well. Patient is discharged home in stable condition on  11/12/20        Newborn Data: Birth date:   JaPallas, Wahlert0[[id number]]    JaSophiea, Ueda[[id number]]11/10/2020   Birth time:   JaMarceil, Welp0[[id number]]    JaFrancys, Bolinandice [[id number]]2:05 PM   Gender:   JaNimsi, Males0[[id number]]    JaGwendy, BoederaShuqualak0[[id number]]Female   Living status:   JaMazie, Fencl0[[id number]]    JaDevetta, Hagenowa[place]ase[place]0[[id number]]Living   Apgars:   JaFoye, Haggart0[[id number]]    JaFaren, FlorenceaHills0[[id number]]8  ,   JaJillann, Charette0[[id number]]    JaKaybree, Williamsa[place]0[[id number]]9   Weight:   JaOliana, Gowens0[[id number]]    JaNayana, LenigaHebron0[[id number]]2375 g      Magnesium Sulfate received: Yes: Seizure prophylaxis BMZ received: No Rhophylac:N/A MMR:N/A T-DaP:Given prenatally Flu: N/A Transfusion:No  Physical exam  Vitals:   11/12/20 0329 11/12/20 0330 11/12/20 0607 11/12/20 0842  BP: 126/75   134/89  Pulse: 86   97  Resp: 18   18  Temp: 99 F (37.2 C)   99.1 F (37.3 C)  TempSrc: Oral   Oral  SpO2: 97% 96%  100%  Weight:   82.8 kg   Height:       General: alert, cooperative and no distress Lochia: appropriate Uterine Fundus: firm Incision: Healing well with no significant drainage DVT Evaluation: No evidence of DVT seen on physical exam. Labs:  Lab Results  Component Value Date   WBC 14.1 (H) 11/12/2020   HGB 8.0 (L) 11/12/2020   HCT 23.4 (L) 11/12/2020   MCV 101.3 (H) 11/12/2020   PLT 209 11/12/2020   CMP Latest Ref Rng & Units 11/12/2020  Glucose 70 - 99 mg/dL 95  BUN 6 - 20 mg/dL 12  Creatinine 0.44 - 1.00 mg/dL 0.84  Sodium 135 - 145 mmol/L 133(L)  Potassium 3.5 - 5.1 mmol/L 3.9  Chloride 98 - 111 mmol/L 105  CO2 22 - 32 mmol/L 23  Calcium 8.9 - 10.3 mg/dL 7.4(L)  Total Protein 6.5 - 8.1 g/dL 4.6(L)  Total Bilirubin 0.3 - 1.2 mg/dL 0.4  Alkaline Phos 38 - 126 U/L 63  AST 15 - 41 U/L 36  ALT 0 - 44 U/L 25   Edinburgh Score: No flowsheet data found.    After visit meds:  Allergies as of 11/12/2020   No Known  Allergies     Medication List    STOP taking these medications   aspirin EC 81 MG tablet   labetalol 200 MG tablet Commonly known as: NORMODYNE     TAKE these medications   esomeprazole 20 MG capsule Commonly known as: NEXIUM Take 20 mg by mouth daily.   ferrous sulfate 325 (65 FE) MG tablet Take 325 mg by mouth daily with breakfast.   levothyroxine 125 MCG tablet Commonly known as: SYNTHROID Take 1 tablet (125 mcg total) by mouth daily.   PRENATAL VITAMINS PO Take 1 tablet by mouth daily.        Discharge home in stable condition Infant Feeding: Breast Infant Disposition:home with mother Discharge instruction: per After Visit Summary and Postpartum booklet. Activity: Advance as tolerated. Pelvic rest for 6 weeks.  Diet: routine diet Anticipated Birth Control: BTL done PP Postpartum Appointment:1 week Additional Postpartum F/U: BP check 1 week Future Appointments: Future Appointments  Date Time Provider [place]  01/06/2021  4:00 PM Philemon Kingdom, MD [id number] None   Follow up Visit:      11/12/2020 Lovenia Kim, MD

## 2020-11-12 NOTE — Procedures (Deleted)
Circumcision note: Parents counseled. Consent signed. Risks vs benefits of procedure discussed. Decreased risks of UTI, STDs and penile cancer noted. Time out done. Ring block with 1 ml 1% xylocaine without complications. Procedure with Gomco 1.1 without complications. EBL: minimal  Pt tolerated procedure well. 

## 2020-11-14 ENCOUNTER — Inpatient Hospital Stay (HOSPITAL_COMMUNITY): Payer: BLUE CROSS/BLUE SHIELD

## 2020-11-14 ENCOUNTER — Inpatient Hospital Stay (HOSPITAL_COMMUNITY)
Admission: AD | Admit: 2020-11-14 | Discharge: 2020-11-14 | Disposition: A | Discharge status: Home / Self Care | Payer: BLUE CROSS/BLUE SHIELD | Attending: Obstetrics and Gynecology | Admitting: Obstetrics and Gynecology

## 2020-11-14 ENCOUNTER — Other Ambulatory Visit: Payer: Self-pay

## 2020-11-14 ENCOUNTER — Encounter (HOSPITAL_COMMUNITY): Payer: Self-pay | Admitting: Obstetrics and Gynecology

## 2020-11-14 DIAGNOSIS — D62 Acute posthemorrhagic anemia: Secondary | ICD-10-CM | POA: Diagnosis not present

## 2020-11-14 DIAGNOSIS — R030 Elevated blood-pressure reading, without diagnosis of hypertension: Secondary | ICD-10-CM | POA: Diagnosis not present

## 2020-11-14 DIAGNOSIS — R079 Chest pain, unspecified: Secondary | ICD-10-CM | POA: Diagnosis not present

## 2020-11-14 DIAGNOSIS — O9081 Anemia of the puerperium: Secondary | ICD-10-CM | POA: Diagnosis not present

## 2020-11-14 DIAGNOSIS — O1415 Severe pre-eclampsia, complicating the puerperium: Secondary | ICD-10-CM | POA: Insufficient documentation

## 2020-11-14 DIAGNOSIS — J90 Pleural effusion, not elsewhere classified: Secondary | ICD-10-CM | POA: Diagnosis not present

## 2020-11-14 DIAGNOSIS — J9811 Atelectasis: Secondary | ICD-10-CM | POA: Diagnosis not present

## 2020-11-14 DIAGNOSIS — O1414 Severe pre-eclampsia complicating childbirth: Secondary | ICD-10-CM

## 2020-11-14 DIAGNOSIS — O9903 Anemia complicating the puerperium: Secondary | ICD-10-CM

## 2020-11-14 DIAGNOSIS — Z98891 History of uterine scar from previous surgery: Secondary | ICD-10-CM

## 2020-11-14 DIAGNOSIS — O99893 Other specified diseases and conditions complicating puerperium: Secondary | ICD-10-CM | POA: Diagnosis not present

## 2020-11-14 LAB — COMPREHENSIVE METABOLIC PANEL
ALT: 33 U/L (ref 0–44)
AST: 43 U/L — ABNORMAL HIGH (ref 15–41)
Albumin: 2.6 g/dL — ABNORMAL LOW (ref 3.5–5.0)
Alkaline Phosphatase: 76 U/L (ref 38–126)
Anion gap: 10 (ref 5–15)
BUN: 8 mg/dL (ref 6–20)
CO2: 25 mmol/L (ref 22–32)
Calcium: 8.8 mg/dL — ABNORMAL LOW (ref 8.9–10.3)
Chloride: 100 mmol/L (ref 98–111)
Creatinine, Ser: 0.76 mg/dL (ref 0.44–1.00)
GFR, Estimated: >60 mL/min (ref >60)
Glucose, Bld: 105 mg/dL — ABNORMAL HIGH (ref 70–99)
Potassium: 3.7 mmol/L (ref 3.5–5.1)
Sodium: 135 mmol/L (ref 135–145)
Total Bilirubin: 1.1 mg/dL (ref 0.3–1.2)
Total Protein: 5.9 g/dL — ABNORMAL LOW (ref 6.5–8.1)

## 2020-11-14 LAB — CBC
HCT: 27.4 % — ABNORMAL LOW (ref 36.0–46.0)
Hemoglobin: 9.1 g/dL — ABNORMAL LOW (ref 12.0–15.0)
MCH: 34 pg (ref 26.0–34.0)
MCHC: 33.2 g/dL (ref 30.0–36.0)
MCV: 102.2 fL — ABNORMAL HIGH (ref 80.0–100.0)
Platelets: 249 10*3/uL (ref 150–400)
RBC: 2.68 MIL/uL — ABNORMAL LOW (ref 3.87–5.11)
RDW: 13.3 % (ref 11.5–15.5)
WBC: 13.6 10*3/uL — ABNORMAL HIGH (ref 4.0–10.5)
nRBC: 0.4 % — ABNORMAL HIGH (ref 0.0–0.2)

## 2020-11-14 LAB — TROPONIN I (HIGH SENSITIVITY): Troponin I (High Sensitivity): 8 ng/L (ref <18)

## 2020-11-14 LAB — PROTEIN / CREATININE RATIO, URINE
Creatinine, Urine: 37.62 mg/dL
Protein Creatinine Ratio: 0.32 mg/mg{Cre} — ABNORMAL HIGH (ref 0.00–0.15)
Total Protein, Urine: 12 mg/dL

## 2020-11-14 MED ORDER — LABETALOL HCL 5 MG/ML IV SOLN: 40.0000 mg | INTRAVENOUS | Status: DC | PRN

## 2020-11-14 MED ORDER — IOHEXOL 350 MG/ML SOLN
80.0000 mL | Freq: Once | INTRAVENOUS | Status: AC | PRN
  Administered 2020-11-14: 80 mL via INTRAVENOUS

## 2020-11-14 MED ORDER — AMLODIPINE BESYLATE 5 MG PO TABS
5.0000 mg | ORAL_TABLET | Freq: Every day | ORAL | Status: DC
  Administered 2020-11-14: 12:00:00 5 mg via ORAL
  Filled 2020-11-14: qty 1

## 2020-11-14 MED ORDER — ONDANSETRON 4 MG PO TBDP: 4.0000 mg | ORAL_TABLET | Freq: Once | ORAL | Status: DC

## 2020-11-14 MED ORDER — AMLODIPINE BESYLATE 5 MG PO TABS: 5.0000 mg | ORAL_TABLET | Freq: Every day | ORAL | 0 refills | Status: DC

## 2020-11-14 MED ORDER — FUROSEMIDE 10 MG/ML IJ SOLN
20.0000 mg | Freq: Once | INTRAMUSCULAR | Status: AC
  Administered 2020-11-14: 12:00:00 20 mg via INTRAVENOUS
  Filled 2020-11-14: qty 2

## 2020-11-14 MED ORDER — HYDRALAZINE HCL 20 MG/ML IJ SOLN: 10.0000 mg | INTRAMUSCULAR | Status: DC | PRN

## 2020-11-14 MED ORDER — LABETALOL HCL 5 MG/ML IV SOLN: 80.0000 mg | INTRAVENOUS | Status: DC | PRN

## 2020-11-14 MED ORDER — OXYCODONE-ACETAMINOPHEN 5-325 MG PO TABS
1.0000 | ORAL_TABLET | Freq: Once | ORAL | Status: AC
  Administered 2020-11-14: 07:00:00 1 via ORAL
  Filled 2020-11-14: qty 1

## 2020-11-14 MED ORDER — LABETALOL HCL 5 MG/ML IV SOLN
20.0000 mg | INTRAVENOUS | Status: DC | PRN
  Administered 2020-11-14: 06:00:00 20 mg via INTRAVENOUS
  Filled 2020-11-14: qty 4

## 2020-11-14 NOTE — MAU Note (Addendum)
Patient reports to MAU complaining of chest pain and high blood pressure. At home blood pressure was 157/90. Denies shortness of breath. Denies visual changes.

## 2020-11-14 NOTE — Discharge Instructions (Signed)
Postpartum Hypertension Postpartum hypertension is high blood pressure that remains higher than normal after childbirth. You may not realize that you have postpartum hypertension if your blood pressure is not being checked regularly. In most cases, postpartum hypertension will go away on its own, usually within a week of delivery. However, for some women, medical treatment is required to prevent serious complications, such as seizures or stroke. What are the causes? This condition may be caused by one or more of the following:  Hypertension that existed before pregnancy (chronic hypertension).  Hypertension that comes on as a result of pregnancy (gestational hypertension).  Hypertensive disorders during pregnancy (preeclampsia) or seizures in women who have high blood pressure during pregnancy (eclampsia).  A condition in which the liver, platelets, and red blood cells are damaged during pregnancy (HELLP syndrome).  A condition in which the thyroid produces too much hormones (hyperthyroidism).  Other rare problems of the nerves (neurological disorders) or blood disorders. In some cases, the cause may not be known. What increases the risk? The following factors may make you more likely to develop this condition:  Chronic hypertension. In some cases, this may not have been diagnosed before pregnancy.  Obesity.  Type 2 diabetes.  Kidney disease.  History of preeclampsia or eclampsia.  Other medical conditions that change the level of hormones in the body (hormonal imbalance). What are the signs or symptoms? As with all types of hypertension, postpartum hypertension may not have any symptoms. Depending on how high your blood pressure is, you may experience:  Headaches. These may be mild, moderate, or severe. They may also be steady, constant, or sudden in onset (thunderclap headache).  Changes in your ability to see (visual changes).  Dizziness.  Shortness of breath.  Swelling  of your hands, feet, lower legs, or face. In some cases, you may have swelling in more than one of these locations.  Heart palpitations or a racing heartbeat.  Difficulty breathing while lying down.  Decrease in the amount of urine that you pass. Other rare signs and symptoms may include:  Sweating more than usual. This lasts longer than a few days after delivery.  Chest pain.  Sudden dizziness when you get up from sitting or lying down.  Seizures.  Nausea or vomiting.  Abdominal pain. How is this diagnosed? This condition may be diagnosed based on the results of a physical exam, blood pressure measurements, and blood and urine tests. You may also have other tests, such as a CT scan or an MRI, to check for other problems of postpartum hypertension. How is this treated? If blood pressure is high enough to require treatment, your options may include:  Medicines to reduce blood pressure (antihypertensives). Tell your health care provider if you are breastfeeding or if you plan to breastfeed. There are many antihypertensive medicines that are safe to take while breastfeeding.  Stopping medicines that may be causing hypertension.  Treating medical conditions that are causing hypertension.  Treating the complications of hypertension, such as seizures, stroke, or kidney problems. Your health care provider will also continue to monitor your blood pressure closely until it is within a safe range for you. Follow these instructions at home:  Take over-the-counter and prescription medicines only as told by your health care provider.  Return to your normal activities as told by your health care provider. Ask your health care provider what activities are safe for you.  Do not use any products that contain nicotine or tobacco, such as cigarettes and e-cigarettes. If   you need help quitting, ask your health care provider.  Keep all follow-up visits as told by your health care provider. This  is important. Contact a health care provider if:  Your symptoms get worse.  You have new symptoms, such as: ? A headache that does not get better. ? Dizziness. ? Visual changes. Get help right away if:  You suddenly develop swelling in your hands, ankles, or face.  You have sudden, rapid weight gain.  You develop difficulty breathing, chest pain, racing heartbeat, or heart palpitations.  You develop severe pain in your abdomen.  You have any symptoms of a stroke. "BE FAST" is an easy way to remember the main warning signs of a stroke: ? B - Balance. Signs are dizziness, sudden trouble walking, or loss of balance. ? E - Eyes. Signs are trouble seeing or a sudden change in vision. ? F - Face. Signs are sudden weakness or numbness of the face, or the face or eyelid drooping on one side. ? A - Arms. Signs are weakness or numbness in an arm. This happens suddenly and usually on one side of the body. ? S - Speech. Signs are sudden trouble speaking, slurred speech, or trouble understanding what people say. ? T - Time. Time to call emergency services. Write down what time symptoms started.  You have other signs of a stroke, such as: ? A sudden, severe headache with no known cause. ? Nausea or vomiting. ? Seizure. These symptoms may represent a serious problem that is an emergency. Do not wait to see if the symptoms will go away. Get medical help right away. Call your local emergency services (911 in the [place] Do not drive yourself to the hospital. Summary  Postpartum hypertension is high blood pressure that remains higher than normal after childbirth.  In most cases, postpartum hypertension will go away on its own, usually within a week of delivery.  For some women, medical treatment is required to prevent serious complications, such as seizures or stroke. This information is not intended to replace advice given to you by your health care provider. Make sure you discuss any questions  you have with your health care provider. Document Revised: 12/13/2018 Document Reviewed: 08/27/2017 Elsevier Patient Education  2020 [employer]  

## 2020-11-14 NOTE — [facility] Provider Note (Addendum)
Chief Complaint  Patient presents with  . Chest Pain     Event Date/Time   First Provider Initiated Contact with Patient 11/14/20 0429      S: Samantha Meyer  is a 34 y.o. y.o. year old [id number] female at 4 days postpartum C-section who presents to [facility]U with left-sided chest pain since last night and continued elevated blood pressures.  Positive Hx preeclampsia. Received Magnesium Sulfate for seizure prophylaxis during her admission for delivery. Was not D/C's on BP meds.    Location: under left breast and back on left shoulder Referred Pain: No radiation to jaw, neck, arm.  Aggravating or alleviating factors: No improvement w/ Tylenol.  Associated symptoms: Negative for headache, vision changes, epigastric pain, fever, chills, SOB, cough, calf pain.   Hx MVP S/P repair. States this does not feel like pain that she has had for that problems in the past. If closely followed by cardiology. Nml Echo x 2 this pregnancy.   O:  Patient Vitals for the past 24 hrs:  BP Temp Temp src Pulse Resp SpO2  11/14/20 1246 (!) 148/96 98.5 F (36.9 C) -- 82 16 100 %  11/14/20 1231 (!) 146/88 -- -- 82 -- --  11/14/20 1216 (!) 132/91 -- -- 82 -- --  11/14/20 1131 136/77 -- -- 90 -- --  11/14/20 1116 127/75 -- -- 81 -- --  11/14/20 1101 130/76 -- -- 81 -- --  11/14/20 1047 131/77 98.1 F (36.7 C) Oral 84 16 --  11/14/20 1001 135/75 -- -- 78 -- --  11/14/20 0946 (!) 146/88 -- -- 79 -- --  11/14/20 0931 138/84 -- -- 92 -- --  11/14/20 0916 139/86 -- -- 80 -- --  11/14/20 0901 139/85 -- -- 88 -- --  11/14/20 0846 134/90 -- -- 76 -- --  11/14/20 0831 (!) 144/87 -- -- 77 -- --  11/14/20 0816 (!) 142/81 -- -- 81 -- --  11/14/20 0801 127/83 -- -- 92 -- --  11/14/20 0746 139/89 -- -- 77 -- --  11/14/20 0731 (!) 138/93 -- -- 75 -- --  11/14/20 0716 (!) 143/89 -- -- 93 -- --  11/14/20 0701 (!) 147/88 -- -- 77 -- --  11/14/20 0646 130/80 -- -- 77 -- --  11/14/20 0631 137/81 -- -- 78 -- --  11/14/20 0616  134/82 -- -- 82 -- --  11/14/20 0601 138/80 -- -- 80 -- --  11/14/20 0546 (!) 154/87 -- -- 80 -- --  11/14/20 0530 (!) 166/90 -- -- 80 -- --  11/14/20 0501 (!) 149/95 -- -- 79 -- --  11/14/20 0446 (!) 148/93 -- -- 83 -- --  11/14/20 0445 -- -- -- -- -- 97 %  11/14/20 0440 -- -- -- -- -- 97 %  11/14/20 0435 -- -- -- -- -- 98 %  11/14/20 0431 (!) 159/89 -- -- 81 -- --  11/14/20 0430 -- -- -- -- -- 97 %  11/14/20 0425 -- -- -- -- -- 96 %  11/14/20 0420 -- -- -- -- -- 97 %  11/14/20 0416 (!) 151/102 -- -- 81 -- --  11/14/20 0415 -- -- -- -- -- 97 %  11/14/20 0411 (!) 157/100 -- -- 84 -- --   General: mild distress Skin: No pallor or cyanosis Heart: Regular rate and rhythm  Lungs: Normal rate and effort Abd: Soft, NT. Fundus 2/SP, NT. + BS x 4.  Extremities: 1+ Pedal edema Neuro: 2+ deep tendon reflexes, No  clonus Pelvic: Deferred    Results for orders placed or performed during the hospital encounter of 11/14/20 (from the past 24 hour(s))  Comprehensive metabolic panel     Status: Abnormal   Collection Time: 11/14/20  4:34 AM  Result Value Ref Range   Sodium 135 135 - 145 mmol/L   Potassium 3.7 3.5 - 5.1 mmol/L   Chloride 100 98 - 111 mmol/L   CO2 25 22 - 32 mmol/L   Glucose, Bld 105 (H) 70 - 99 mg/dL   BUN 8 6 - 20 mg/dL   Creatinine, Ser 9.24 0.44 - 1.00 mg/dL   Calcium 8.8 (L) 8.9 - 10.3 mg/dL   Total Protein 5.9 (L) 6.5 - 8.1 g/dL   Albumin 2.6 (L) 3.5 - 5.0 g/dL   AST 43 (H) 15 - 41 U/L   ALT 33 0 - 44 U/L   Alkaline Phosphatase 76 38 - 126 U/L   Total Bilirubin 1.1 0.3 - 1.2 mg/dL   GFR, Estimated >26 >83 mL/min   Anion gap 10 5 - 15  CBC     Status: Abnormal   Collection Time: 11/14/20  4:34 AM  Result Value Ref Range   WBC 13.6 (H) 4.0 - 10.5 K/uL   RBC 2.68 (L) 3.87 - 5.11 MIL/uL   Hemoglobin 9.1 (L) 12.0 - 15.0 g/dL   HCT 41.9 (L) 62.2 - 29.7 %   MCV 102.2 (H) 80.0 - 100.0 fL   MCH 34.0 26.0 - 34.0 pg   MCHC 33.2 30.0 - 36.0 g/dL   RDW 98.9 21.1 - 94.1 %    Platelets 249 150 - 400 K/uL   nRBC 0.4 (H) 0.0 - 0.2 %  Protein / creatinine ratio, urine     Status: Abnormal   Collection Time: 11/14/20  5:03 AM  Result Value Ref Range   Creatinine, Urine 37.62 mg/dL   Total Protein, Urine 12 mg/dL   Protein Creatinine Ratio 0.32 (H) 0.00 - 0.15 mg/mg[Cre]  Troponin I (High Sensitivity)     Status: None   Collection Time: 11/14/20  9:38 AM  Result Value Ref Range   Troponin I (High Sensitivity) 8 <18 ng/L    [facility]U Course Orders Placed This Encounter  Procedures  . DG Chest 2 View    Postpartum, preeclampsia, Hx MVP and repair    Standing Status:   Standing    Number of Occurrences:   1    Order Specific Question:   Symptom/Reason for Exam    Answer:   Chest pain [[id number]]  . CT ANGIO CHEST PE W OR WO CONTRAST    Standing Status:   Standing    Number of Occurrences:   1    Order Specific Question:   Does the patient have a contrast media/X-ray dye allergy?    Answer:   No    Order Specific Question:   If indicated for the ordered procedure, I authorize the administration of contrast media per Radiology protocol    Answer:   Yes  . Comprehensive metabolic panel    Standing Status:   Standing    Number of Occurrences:   1  . CBC    Standing Status:   Standing    Number of Occurrences:   1  . Protein / creatinine ratio, urine    Standing Status:   Standing    Number of Occurrences:   1  . Notify Physician    Confirmatory reading of BP> 160/110 15 minutes later  Standing Status:   Standing    Number of Occurrences:   1    Order Specific Question:   Notify Physician    Answer:   Temp greater than or equal to 100.4    Order Specific Question:   Notify Physician    Answer:   RR greater than 24 or less than 10    Order Specific Question:   Notify Physician    Answer:   HR greater than 120 or less than 50    Order Specific Question:   Notify Physician    Answer:   SBP greater than 160 mmHG or less than 80 mmHG    Order Specific  Question:   Notify Physician    Answer:   DBP greater than 110 mmHG or less than 45 mmHG    Order Specific Question:   Notify Physician    Answer:   Urinary output is less than 120ml for any 4 hour period  . Measure blood pressure    20 minutes after giving hydralazine 10 MG IV dose.  Call MD if SBP >/= 160 or DBP >/= 110.    Standing Status:   Standing    Number of Occurrences:   1  . Vital signs    Standing Status:   Standing    Number of Occurrences:   1  . Pulse oximetry check with vital signs    Standing Status:   Standing    Number of Occurrences:   1  . EKG 12-Lead    Standing Status:   Standing    Number of Occurrences:   1    Order Specific Question:   Reason for Exam    Answer:   Chest pain, postpartum, HTN  . Discharge patient    Order Specific Question:   Discharge disposition    Answer:   01-Home or Self Care [1]    Order Specific Question:   Discharge patient date    Answer:   11/14/2020   Meds ordered this encounter  Medications  . AND Linked Order Group   . labetalol (NORMODYNE) injection 20 mg   . labetalol (NORMODYNE) injection 40 mg   . labetalol (NORMODYNE) injection 80 mg   . hydrALAZINE (APRESOLINE) injection 10 mg  . oxyCODONE-acetaminophen (PERCOCET/ROXICET) 5-325 MG per tablet 1 tablet  . ondansetron (ZOFRAN-ODT) disintegrating tablet 4 mg  . iohexol (OMNIPAQUE) 350 MG/ML injection 80 mL  . furosemide (LASIX) injection 20 mg  . amLODipine (NORVASC) tablet 5 mg  . amLODipine (NORVASC) 5 MG tablet    Sig: Take 1 tablet (5 mg total) by mouth daily.    Dispense:  30 tablet    Refill:  0    Order Specific Question:   Supervising Provider    Answer:   Warden FillersBASS, LAWRENCE A [[id number]]    DG Chest 2 View  Result Date: 11/14/2020 CLINICAL DATA:  Left-sided chest pain EXAM: CHEST - 2 VIEW COMPARISON:  02/06/2012 FINDINGS: Normal heart size and stable mediastinal contours. Mitral valve annuloplasty. Trace pleural fluid on the left. There is no edema,  consolidation, or pneumothorax. IMPRESSION: Trace pleural fluid on the left. Electronically Signed   By: Marnee SpringJonathon  Watts M.D.   On: 11/14/2020 05:30   CT ANGIO CHEST PE W OR WO CONTRAST  Result Date: 11/14/2020 CLINICAL DATA:  Negative D-dimer, low/intermediate probability PE EXAM: CT ANGIOGRAPHY CHEST WITH CONTRAST TECHNIQUE: Multidetector CT imaging of the chest was performed using the standard protocol during bolus administration of intravenous contrast. Multiplanar  CT image reconstructions and MIPs were obtained to evaluate the vascular anatomy. CONTRAST:  47mL OMNIPAQUE IOHEXOL 350 MG/ML SOLN COMPARISON:  06/21/2008 FINDINGS: Cardiovascular: Heart size upper limits normal. No pericardial effusion. The RV is nondilated. The main PA is mildly dilated. Satisfactory opacification of pulmonary arteries noted, and there is no evidence of pulmonary emboli. Interval mitral valve surgery. Adequate contrast opacification of the thoracic aorta with no evidence of dissection, aneurysm, or stenosis. 5 mm hyperdensity abutting the anterior margin of the ascending segment presumably postop change. There is classic 3-vessel brachiocephalic arch anatomy without proximal stenosis. Mediastinum/Nodes: No mass or adenopathy. Lungs/Pleura: Trace pleural effusions. No pneumothorax. Minimal dependent atelectasis in both lung bases. Lungs otherwise clear. Upper Abdomen: No acute findings Musculoskeletal: No chest wall abnormality. No acute or significant osseous findings. Review of the MIP images confirms the above findings. IMPRESSION: 1. Negative for acute PE or thoracic aortic dissection. 2. Interval mitral valve surgery. 3. Trace bilateral pleural effusions. Electronically Signed   By: Corlis Leak M.D.   On: 11/14/2020 10:56    MAU Course Pain decreased from 7/10 to 5/10 w/ Percocet. Looking more comfortable. Pre-E labs essentially nml. BP's now 130's/80's. Low suspicion for PE due to absence of tachycardia, SOB, calf pain  or swelling. Suspect gas pain. Discussed Hx, labs, exam w/ Dr. Donavan Foil. Agrees w/ POC. New orders: Pt is appropriate for D/C w/ close F/U at [facility] on Monday.   When CNM returned to room pt appeared more uncomfortable and restless. CNM consulted w/ Dr. Jeanella Anton in ED to who recommends Troponin and imaging. Care of pt turned over to Donette Larry, CNM at 9:35 am.  Dorathy Kinsman, CNM 11/14/20 0930  CT ordered, Troponin pending. No PE or dissection on CT, trace bilat pleural effusions. BP improved after 1 dose IV Labetalol. Discussed results with Dr. Donavan Foil, recommend single dose of Lasix and start Norvasc. Discussed findings and plan with pt. Pain likely MSK or post-op trapped gas. Discussed comfort measures. Stable for discharge home.   A/P: 1. Status post cesarean delivery   2. Chest pain   3. Hypertension in pregnancy, preeclampsia, severe, delivered   4. Acute blood loss anemia    Discharge home Follow up at [facility] in 2 days for BP check- message left at office Tylenol/heating pad prn Return precautions Rx Norvasc  Allergies as of 11/14/2020   No Known Allergies     Medication List    TAKE these medications   amLODipine 5 MG tablet Commonly known as: NORVASC Take 1 tablet (5 mg total) by mouth daily.   esomeprazole 20 MG capsule Commonly known as: NEXIUM Take 20 mg by mouth daily.   ferrous sulfate 325 (65 FE) MG tablet Take 325 mg by mouth daily with breakfast.   levothyroxine 125 MCG tablet Commonly known as: SYNTHROID Take 1 tablet (125 mcg total) by mouth daily.   PRENATAL VITAMINS PO Take 1 tablet by mouth daily.       Donette Larry, CNM  11/14/2020 1:32 PM

## 2020-11-15 LAB — SURGICAL PATHOLOGY

## 2020-11-18 ENCOUNTER — Ambulatory Visit: Payer: BLUE CROSS/BLUE SHIELD

## 2021-01-06 ENCOUNTER — Ambulatory Visit: Payer: BLUE CROSS/BLUE SHIELD | Admitting: Internal Medicine

## 2021-02-24 ENCOUNTER — Telehealth: Payer: Self-pay

## 2021-02-24 DIAGNOSIS — E890 Postprocedural hypothyroidism: Secondary | ICD-10-CM

## 2021-02-24 NOTE — Telephone Encounter (Signed)
Patient called in requesting a refill on  Rx   levothyroxine (SYNTHROID) 125 MCG tablet   [facility] - [place], [place] - [address]

## 2021-02-25 MED ORDER — LEVOTHYROXINE SODIUM 125 MCG PO TABS: 125.0000 ug | ORAL_TABLET | Freq: Every day | ORAL | 0 refills | Status: DC

## 2021-02-25 NOTE — Telephone Encounter (Signed)
Rx sent to preferred pharmacy.

## 2021-03-11 ENCOUNTER — Ambulatory Visit: Payer: Self-pay | Admitting: Internal Medicine

## 2021-05-06 ENCOUNTER — Telehealth: Payer: Self-pay | Admitting: Internal Medicine

## 2021-05-06 DIAGNOSIS — E890 Postprocedural hypothyroidism: Secondary | ICD-10-CM

## 2021-05-06 NOTE — Telephone Encounter (Signed)
MEDICATION: levothyroxine (SYNTHROID) 125 MCG tablet   PHARMACY:  [facility]  HAS THE PATIENT CONTACTED THEIR PHARMACY?  No (was advised to do so in the future)  IS THIS A 90 DAY SUPPLY :   IS PATIENT OUT OF MEDICATION:   IF NOT; HOW MUCH IS LEFT:   LAST APPOINTMENT DATE: @4 /05/2021  NEXT APPOINTMENT DATE:@Visit  date not found  DO WE HAVE YOUR PERMISSION TO LEAVE A DETAILED MESSAGE?:  OTHER COMMENTS:    **Let patient know to contact pharmacy at the end of the day to make sure medication is ready. **  ** Please notify patient to allow 48-72 hours to process**  **Encourage patient to contact the pharmacy for refills or they can request refills through [facility]**

## 2021-05-10 MED ORDER — LEVOTHYROXINE SODIUM 125 MCG PO TABS: 125.0000 ug | ORAL_TABLET | Freq: Every day | ORAL | 0 refills | Status: DC

## 2021-05-10 NOTE — Telephone Encounter (Signed)
Rx sent to preferred pharmacy. Pt contacted via MyChart message to schedule follow up appt.

## 2021-06-17 ENCOUNTER — Other Ambulatory Visit: Payer: Self-pay

## 2021-06-17 ENCOUNTER — Telehealth: Payer: Self-pay | Admitting: Internal Medicine

## 2021-06-17 DIAGNOSIS — E890 Postprocedural hypothyroidism: Secondary | ICD-10-CM

## 2021-06-17 MED ORDER — LEVOTHYROXINE SODIUM 125 MCG PO TABS: 125.0000 ug | ORAL_TABLET | Freq: Every day | ORAL | 0 refills | Status: DC

## 2021-06-17 NOTE — Telephone Encounter (Signed)
MEDICATION: levothyroxine   PHARMACY:   [facility] - [place], [place] - [address] Phone:  [phone]  Fax:  [phone]      HAS THE PATIENT CONTACTED THEIR PHARMACY?  no  IS THIS A 90 DAY SUPPLY : yes  IS PATIENT OUT OF MEDICATION: yes, ran out 2 days ago  IF NOT; HOW MUCH IS LEFT:   LAST APPOINTMENT DATE: @6 /17/2022  NEXT APPOINTMENT DATE:@9 /20/2022  DO WE HAVE YOUR PERMISSION TO LEAVE A DETAILED MESSAGE?: yes  **Let patient know to contact pharmacy at the end of the day to make sure medication is ready. **  ** Please notify patient to allow 48-72 hours to process**  **Encourage patient to contact the pharmacy for refills or they can request refills through [facility]**

## 2021-07-22 ENCOUNTER — Telehealth: Payer: Self-pay | Admitting: Internal Medicine

## 2021-07-22 DIAGNOSIS — E890 Postprocedural hypothyroidism: Secondary | ICD-10-CM

## 2021-07-22 NOTE — Telephone Encounter (Signed)
MEDICATION: Levothyroxine  PHARMACY:  [facility] [address]  HAS THE PATIENT CONTACTED THEIR PHARMACY?  no  IS THIS A 90 DAY SUPPLY : no  IS PATIENT OUT OF MEDICATION: yes  IF NOT; HOW MUCH IS LEFT:   LAST APPOINTMENT DATE: @7 /29/2022  NEXT APPOINTMENT DATE:@9 /20/2022  DO WE HAVE YOUR PERMISSION TO LEAVE A DETAILED MESSAGE?: yes

## 2021-07-26 MED ORDER — LEVOTHYROXINE SODIUM 125 MCG PO TABS: 125.0000 ug | ORAL_TABLET | Freq: Every day | ORAL | 0 refills | Status: DC

## 2021-08-09 ENCOUNTER — Ambulatory Visit: Payer: Self-pay | Admitting: Internal Medicine

## 2021-08-09 NOTE — Progress Notes (Deleted)
Patient ID: Samantha Meyer, female   DOB: July 02, 1986, 35 y.o.   MRN: [id number]   This visit occurred during the SARS-CoV-2 public health emergency.  Safety protocols were in place, including screening questions prior to the visit, additional usage of staff PPE, and extensive cleaning of exam room while observing appropriate contact time as indicated for disinfecting solutions.   HPI  Samantha Meyer is a 35 y.o.-year-old female, initially referred by Dr. Billy Coast, returning for follow-up for postsurgical hypothyroidism.  Last visit a year ago.  Interim history: When I last saw the patient, she was pregnant [redacted] weeks along.  She gave birth by C-section in 10/2020.  She had a healthy baby.  Reviewed history: Pt. has a history of MVP (dx at 35 y/o) >> repair surgery in 11/2011. She continued to have anxiety, hot flushes, palpitations, dizziness, weight loss, after the sx. 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   She tried oral medications for hyperthyroidism for 2-3 years >> but developed SVT: tachycardia + syncope >> as she had a large goiter then, she was advised to have total thyroidectomy >> had this in 2016.  After her total thyroidectomy >> started on Levothyroxine but she was noncompliant and her endocrinologist left practice >> TFTs fluctuating but mostly very high (see below) >> Dr. Billy Coast referred her to me.   After I started to see her, we discussed at length about possible consequences of uncontrolled hypothyroidism and I recommended that she restarted her levothyroxine (at that time, she was off the medication completely).  We restarted it at 137 mcg daily but we had to decrease the dose to 100 mcg and then to 88 mcg daily after she started to take it consistently.  She found out she was pregnant in 04/2020.  We started to increase the dose of levothyroxine.  She moved it at night and her TSH became uncontrolled again.  I again advised her to move it in the morning.  We also  increase the dose to 137 mcg daily.  Pt is on levothyroxine 137 mcg daily, taken: - in am - fasting - at least 30-60 min from b'fast - no Ca, Fe, PPIs, + multivitamins at night - not on Biotin - on ASA 81 in the evening  Reviewed patient's TFTs: Lab Results  Component Value Date   TSH 1.37 10/25/2020   TSH 0.62 07/22/2020   TSH 1.25 05/26/2020   TSH 10.69 (H) 04/28/2020   TSH 8.24 (H) 01/21/2020   TSH 2.69 07/11/2019   TSH 1.90 08/12/2018   TSH 2.49 06/14/2018   TSH 0.14 (L) 04/11/2018   TSH 0.02 (L) 01/29/2018   FREET4 0.61 10/25/2020   FREET4 0.93 07/22/2020   FREET4 1.10 05/26/2020   FREET4 0.70 04/28/2020   FREET4 1.10 01/21/2020   FREET4 1.33 07/11/2019   FREET4 1.05 08/12/2018   FREET4 0.96 06/14/2018   FREET4 1.22 04/11/2018   FREET4 1.24 01/29/2018  08/31/2017: TSH 19.8, Free T3 2.4 (2-4.4): Free T4 1.41 (0.82-1.77) 06/05/2017: TSH 75  Pt denies: - feeling nodules in neck - hoarseness - dysphagia - choking - SOB with lying down  She has + FH of thyroid disorders in: father - hypothyroidism. No FH of thyroid cancer. No h/o radiation tx to head or neck.  No herbal supplements. No Biotin use. No recent steroids use. She wast hpiseeing cardiology for chest pain.   ROS:  Constitutional: + weight gain/no weight loss, no fatigue, no subjective hyperthermia, no subjective hypothermia Eyes: no  blurry vision, no xerophthalmia ENT: no sore throat, + see HPI Cardiovascular: no CP/no SOB/no palpitations/no leg swelling Respiratory: no cough/no SOB/no wheezing Gastrointestinal: no N/no V/no D/no C/no acid reflux Musculoskeletal: no muscle aches/no joint aches Skin: no rashes, no hair loss Neurological: no tremors/no numbness/no tingling/no dizziness  I reviewed pt's medications, allergies, PMH, social hx, family hx, and changes were documented in the history of present illness. Otherwise, unchanged from my initial visit note.  Past Medical History:  Diagnosis  Date   Hirsutism    Hypertension    Hypothyroidism    Mitral incompetence    Mitral valve prolapse    Pregnancy induced hypertension    Past Surgical History:  Procedure Laterality Date   CESAREAN SECTION     CESAREAN SECTION N/A 11/10/2020   Procedure: CESAREAN SECTION;  Surgeon: Vick Frees, MD;  Location: [facility];  Service: Obstetrics;  Laterality: N/A;   DILATION AND CURETTAGE OF UTERUS     MITRAL VALVE REPAIR  01/29/12   TOTAL THYROIDECTOMY     Social History   Socioeconomic History   Marital status: Married    Spouse name: Not on file   Number of children: 1 boy - 36 y/o in 2018  Social Needs  Occupational History   counselor  Tobacco Use   Smoking status: Never Smoker   Smokeless tobacco: Never Used  Substance and Sexual Activity   Alcohol use: No   Drug use: No   Sexual activity: Yes    Birth control/protection: IUD   Current Outpatient Medications  Medication Sig Dispense Refill   amLODipine (NORVASC) 5 MG tablet Take 1 tablet (5 mg total) by mouth daily. 30 tablet 0   esomeprazole (NEXIUM) 20 MG capsule Take 20 mg by mouth daily.     ferrous sulfate 325 (65 FE) MG tablet Take 325 mg by mouth daily with breakfast.     levothyroxine (SYNTHROID) 125 MCG tablet Take 1 tablet (125 mcg total) by mouth daily. 30 tablet 0   Prenatal Vit-Fe Fumarate-FA (PRENATAL VITAMINS PO) Take 1 tablet by mouth daily.     No current facility-administered medications for this visit.    No Known Allergies   FHL: see HPI  PE: There were no vitals taken for this visit. Wt Readings from Last 3 Encounters:  11/12/20 182 lb 8 oz (82.8 kg)  11/05/20 185 lb (83.9 kg)  11/08/20 184 lb 8 oz (83.7 kg)   Constitutional: overweight, in NAD Eyes: PERRLA, EOMI, no exophthalmos ENT: moist mucous membranes, no neck masses palpated, thyroidectomy scar healed, no cervical lymphadenopathy Cardiovascular: RRR, No MRG Respiratory: CTA B Gastrointestinal: abdomen soft, NT, ND,  BS+ Musculoskeletal: no deformities, strength intact in all 4 Skin: moist, warm, no rashes Neurological: no tremor with outstretched hands, DTR normal in all 4  ASSESSMENT: 1.  Postsurgical hypothyroidism  PLAN:  1. Patient with longstanding, uncontrolled, hypothyroidism, previously noncompliant with her levothyroxine therapy.  In summer 2018 she had a TSH of 75, while being off levothyroxine.  She then restarted to take the thyroid hormone but was skipping doses or taking it too close to breakfast so her TSH only improved to 19.  When she started to take it consistently afterwards, she developed headaches.  We had to decrease the dose to 88 mcg daily and on this dose, her tests finally normalized in 05/2018.  She started to feel much better, without headaches and fatigue. -She sent me a message through my chart that she was pregnant in  04/2020.  We increased the dose of levothyroxine gradually.  Unfortunately, before last visit she moved levothyroxine at night and TSH were not controlled.  I strongly advised her to move into the morning and we increased the dose to 137 mcg.  On this dose, levothyroxine was excellent in 10/2020, at the end of her pregnancy: Lab Results  Component Value Date   TSH 1.37 10/25/2020  - she continues on LT4 137 mcg daily, but we discussed that she most likely will need to decrease the dose after today's labs. - pt feels good on this dose. - we discussed about taking the thyroid hormone every day, with water, >30 minutes before breakfast, separated by >4 hours from acid reflux medications, calcium, iron, multivitamins. Pt. is taking it correctly. - will check thyroid tests today: TSH and fT4 - If labs are abnormal, she will need to return for repeat TFTs in 1.5 months -Otherwise, we will see her back in 6 months  Samantha Pavlov, MD PhD [facility] Endocrinology

## 2021-09-09 ENCOUNTER — Other Ambulatory Visit: Payer: Self-pay

## 2021-09-09 ENCOUNTER — Ambulatory Visit (INDEPENDENT_AMBULATORY_CARE_PROVIDER_SITE_OTHER): Payer: BC Managed Care – PPO | Admitting: Internal Medicine

## 2021-09-09 ENCOUNTER — Encounter: Payer: Self-pay | Admitting: Internal Medicine

## 2021-09-09 VITALS — BP 120/82 | HR 82 | Ht 66.0 in | Wt 159.4 lb

## 2021-09-09 DIAGNOSIS — E890 Postprocedural hypothyroidism: Secondary | ICD-10-CM

## 2021-09-09 LAB — TSH: TSH: 0.25 u[IU]/mL — ABNORMAL LOW (ref 0.35–5.50)

## 2021-09-09 LAB — T4, FREE: Free T4: 1.51 ng/dL (ref 0.60–1.60)

## 2021-09-09 MED ORDER — LEVOTHYROXINE SODIUM 112 MCG PO TABS: 112.0000 ug | ORAL_TABLET | Freq: Every day | ORAL | 3 refills | Status: DC

## 2021-09-09 NOTE — Progress Notes (Signed)
Patient ID: Samantha Meyer, female   DOB: 1986-09-04, 35 y.o.   MRN: [id number]   This visit occurred during the SARS-CoV-2 public health emergency.  Safety protocols were in place, including screening questions prior to the visit, additional usage of staff PPE, and extensive cleaning of exam room while observing appropriate contact time as indicated for disinfecting solutions.   HPI  Samantha Meyer is a 35 y.o.-year-old female, initially referred by Dr. Billy Coast, returning for follow-up for postsurgical hypothyroidism.  Last visit 1 year and 1 mo ago.  Interim hx: She had eclampsia during the pregnancy - C section 10/2020 at 37 weeks >> baby SGA but healthy.  He is now 12 months old.  Not sleeping through the night.  She does feel more tired. She complains of hirsutism on chin - has to wax 1-2x a week, but tweezes daily.  She is looking into laser therapy.  Reviewed history: Pt. has a history of MVP (dx at 35 y/o) >> repair surgery in 11/2011. She continued to have anxiety, hot flushes, palpitations, dizziness, weight loss, after the sx. 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   She tried oral medications for hyperthyroidism for 2-3 years >> but developed SVT: tachycardia + syncope >> as she had a large goiter then, she was advised to have total thyroidectomy >> had this in 2016.  After her total thyroidectomy >> started on Levothyroxine but she was noncompliant and her endocrinologist left practice >> TFTs fluctuating but mostly very high (see below) >> Dr. Billy Coast referred her to me.   After I started to see her, we discussed at length about possible consequences of uncontrolled hypothyroidism and I recommended that she restarted her levothyroxine (at that time, she was off the medication completely).  We restarted it at 137 mcg daily but we had to decrease the dose to 100 mcg and then to 88 mcg daily after she started to take it consistently.  She found out she was pregnant in 04/2020.  We  started to increase the dose of levothyroxine.  She moved it at night and her TSH became uncontrolled again.  I again advised her to move it in the morning.  Pt was on levothyroxine 137 >> now 125 mcg daily after the pregnancy, taken: - in am - fasting - at least 30-60 min from b'fast - no Ca, Fe, PPIs, no multivitamins - not on Biotin  Reviewed patient's TFTs: Lab Results  Component Value Date   TSH 1.37 10/25/2020   TSH 0.62 07/22/2020   TSH 1.25 05/26/2020   TSH 10.69 (H) 04/28/2020   TSH 8.24 (H) 01/21/2020   TSH 2.69 07/11/2019   TSH 1.90 08/12/2018   TSH 2.49 06/14/2018   TSH 0.14 (L) 04/11/2018   TSH 0.02 (L) 01/29/2018   FREET4 0.61 10/25/2020   FREET4 0.93 07/22/2020   FREET4 1.10 05/26/2020   FREET4 0.70 04/28/2020   FREET4 1.10 01/21/2020   FREET4 1.33 07/11/2019   FREET4 1.05 08/12/2018   FREET4 0.96 06/14/2018   FREET4 1.22 04/11/2018   FREET4 1.24 01/29/2018  08/31/2017: TSH 19.8, Free T3 2.4 (2-4.4): Free T4 1.41 (0.82-1.77) 06/05/2017: TSH 75  Pt denies: - feeling nodules in neck - hoarseness - dysphagia - choking - SOB with lying down  She describes intentionally losing approximately 20 pounds before she found out she was pregnant.  Lowest adult weight when she was very active: 145 pounds.  This is her goal now.  She has + FH of thyroid  disorders in: father - hypothyroidism. No FH of thyroid cancer. No h/o radiation tx to head or neck.  No herbal supplements. No Biotin use. No recent steroids use.   ROS:  + See HPI  I reviewed pt's medications, allergies, PMH, social hx, family hx, and changes were documented in the history of present illness. Otherwise, unchanged from my initial visit note.  Past Medical History:  Diagnosis Date   Hirsutism    Hypertension    Hypothyroidism    Mitral incompetence    Mitral valve prolapse    Pregnancy induced hypertension    Past Surgical History:  Procedure Laterality Date   CESAREAN SECTION      CESAREAN SECTION N/A 11/10/2020   Procedure: CESAREAN SECTION;  Surgeon: Vick Frees, MD;  Location: [facility];  Service: Obstetrics;  Laterality: N/A;   DILATION AND CURETTAGE OF UTERUS     MITRAL VALVE REPAIR  01/29/12   TOTAL THYROIDECTOMY     Social History   Socioeconomic History   Marital status: Married    Spouse name: Not on file   Number of children: 1 boy - 55 y/o in 2018  Social Needs  Occupational History   counselor  Tobacco Use   Smoking status: Never Smoker   Smokeless tobacco: Never Used  Substance and Sexual Activity   Alcohol use: No   Drug use: No   Sexual activity: Yes    Birth control/protection: IUD   Current Outpatient Medications  Medication Sig Dispense Refill   amLODipine (NORVASC) 5 MG tablet Take 1 tablet (5 mg total) by mouth daily. 30 tablet 0   esomeprazole (NEXIUM) 20 MG capsule Take 20 mg by mouth daily.     ferrous sulfate 325 (65 FE) MG tablet Take 325 mg by mouth daily with breakfast.     levothyroxine (SYNTHROID) 125 MCG tablet Take 1 tablet (125 mcg total) by mouth daily. 30 tablet 0   Prenatal Vit-Fe Fumarate-FA (PRENATAL VITAMINS PO) Take 1 tablet by mouth daily.     No current facility-administered medications for this visit.    No Known Allergies   FHL: see HPI  PE: BP 120/82 (BP Location: Right Arm, Patient Position: Sitting, Cuff Size: Normal)   Pulse 82   Ht 5\' 6"  (1.676 m)   Wt 159 lb 6.4 oz (72.3 kg)   SpO2 98%   BMI 25.73 kg/m  Wt Readings from Last 3 Encounters:  09/09/21 159 lb 6.4 oz (72.3 kg)  11/12/20 182 lb 8 oz (82.8 kg)  11/05/20 185 lb (83.9 kg)   Constitutional: overweight, in NAD Eyes: PERRLA, EOMI, no exophthalmos ENT: moist mucous membranes, no neck masses palpated, thyroidectomy scar healed, no cervical lymphadenopathy Cardiovascular: tachycardia, RR, No MRG Respiratory: CTA B Gastrointestinal: abdomen soft, NT, ND, BS+ Musculoskeletal: no deformities, strength intact in all 4 Skin: moist,  warm, no rashes Neurological: no tremor with outstretched hands, DTR normal in all 4  ASSESSMENT: 1.  Postsurgical hypothyroidism   PLAN:  1. Patient with longstanding, uncontrolled, hypothyroidism, previously noncompliant with her levothyroxine therapy. In summer 2018 she had a TSH of 75 (she was off her levothyroxine then).  She then restarted to take the thyroid medication but skipped doses and was taking it too close to breakfast so her TSH only improved to 19.  When she started to take it consistently, her TSH level improved. -At the last visit, she was pregnant and we had to increase the dose of levothyroxine to 137 mcg daily.  She gave birth in 10/2020 by C-section. -After pregnancy, we decreased the dose of levothyroxine - latest thyroid labs reviewed with pt. >> normal: Lab Results  Component Value Date   TSH 1.37 10/25/2020  - she continues on LT4 125 mcg daily, dose decreased 09/2020 - pt feels good on this dose. She lost ~30 lbs since last OV - after pregnancy. - we discussed about taking the thyroid hormone every day, with water, >30 minutes before breakfast, separated by >4 hours from acid reflux medications, calcium, iron, multivitamins. Pt. is taking it correctly. - will check thyroid tests today: TSH and fT4 - If labs are abnormal, she will need to return for repeat TFTs in 1.5 months - I will see her back in 1 year  Needs refills.  Component     Latest Ref Rng & Units 09/09/2021  TSH     0.35 - 5.50 uIU/mL 0.25 (L)  T4,Free(Direct)     0.60 - 1.60 ng/dL 1.49  TSH slightly low.  I will advise her to decrease the dose to 112 mcg daily and recheck a test in 1.5 months.  Carlus Pavlov, MD PhD [facility] Endocrinology

## 2021-09-09 NOTE — Patient Instructions (Signed)
Please stop at the lab.  Continue Levothyroxine 125 mcg daily.  Take the thyroid hormone every day, with water, at least 30 minutes before breakfast, separated by at least 4 hours from: - acid reflux medications - calcium - iron - multivitamins  Please come back for a follow-up appointment in 1 year.    

## 2022-04-15 ENCOUNTER — Other Ambulatory Visit: Payer: Self-pay | Admitting: Internal Medicine

## 2022-05-26 DIAGNOSIS — Z9189 Other specified personal risk factors, not elsewhere classified: Secondary | ICD-10-CM | POA: Insufficient documentation

## 2022-05-26 DIAGNOSIS — R0683 Snoring: Secondary | ICD-10-CM | POA: Insufficient documentation

## 2022-07-07 ENCOUNTER — Other Ambulatory Visit: Payer: Self-pay | Admitting: Internal Medicine

## 2022-08-13 ENCOUNTER — Telehealth: Payer: Self-pay | Admitting: Nurse Practitioner

## 2022-08-13 NOTE — Telephone Encounter (Signed)
Patient called, she has clenpiq bowel prep as ordered by Dr. Youlanda Mighty for a colonoscopy tomorrow, arrival time 7:30am. Patient lost her bowel prep instructions.  Advised the patient to take the first dose of Clenpiq around 5 to 6 PM today and drink five 8 ounce glasses of clear liquids. 2nd dose of Clenpiq to be taken 5 hours before her procedure time (around 2AM) with four glasses of clear liquids then nothing by mouth until after procedure completed. She has been on a clear liquid diet all day today.  She did not take Linzess pre procedure as prescribed by Dr. Collene Mares, she did not have these instructions either.  I encouraged her to continue her clear liquid diet until she finishes the second set of instructions of Clenpiq as noted above.

## 2022-09-15 ENCOUNTER — Encounter: Payer: Self-pay | Admitting: Internal Medicine

## 2022-09-15 ENCOUNTER — Ambulatory Visit: Payer: BC Managed Care – PPO | Admitting: Internal Medicine

## 2022-09-15 VITALS — BP 108/76 | HR 80 | Ht 66.0 in | Wt 172.8 lb

## 2022-09-15 DIAGNOSIS — L680 Hirsutism: Secondary | ICD-10-CM

## 2022-09-15 DIAGNOSIS — E890 Postprocedural hypothyroidism: Secondary | ICD-10-CM

## 2022-09-15 NOTE — Progress Notes (Addendum)
Patient ID: Samantha Meyer, female   DOB: [birth year], 36 y.o.   MRN: [id number]   HPI  Samantha Meyer is a 36 y.o.-year-old female, initially referred by Dr. Ronita Hipps, returning for follow-up for postsurgical hypothyroidism.  Last visit 1 year ago.  Interim hx: She still has hirsutism on chin - has to wax 1-2x a week, but tweezes daily.  She always had irregular menstrual cycles and is on oral contraceptives but they are not helping too much with this problem. She has fatigue - she had labs from PCP - anemia, low B12 and vitamin D. Now replaced.  She started a multivitamin and iron, taking on 30 minutes after levothyroxine. She had a sleep study >> no OSA.  Reviewed history: Pt. has a history of MVP (dx at 36 y/o) >> repair surgery in 11/2011. She continued to have anxiety, hot flushes, palpitations, dizziness, weight loss, after the sx. 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   She tried oral medications for hyperthyroidism for 2-3 years >> but developed SVT: tachycardia + syncope >> as she had a large goiter then, she was advised to have total thyroidectomy >> had this in 2016.  After her total thyroidectomy >> started on Levothyroxine but she was noncompliant and her endocrinologist left practice >> TFTs fluctuating but mostly very high (see below) >> Dr. Ronita Hipps referred her to me.   After I started to see her, we discussed at length about possible consequences of uncontrolled hypothyroidism and I recommended that she restarted her levothyroxine (at that time, she was off the medication completely).  We restarted it at 137 mcg daily but we had to decrease the dose to 100 mcg and then to 88 mcg daily after she started to take it consistently.  She found out she was pregnant in 04/2020.  We started to increase the dose of levothyroxine.  She moved it at night and her TSH became uncontrolled again.  I again advised her to move it in the morning.  Pt was on levothyroxine 137 >> 125 >> 112  mcg daily, taken: - in am - fasting - at least 30-60 min from b'fast - no Ca + Fe - no PPIs + multivitamins - not on Biotin  Reviewed patient's TFTs: Lab Results  Component Value Date   TSH 0.25 (L) 09/09/2021   TSH 1.37 10/25/2020   TSH 0.62 07/22/2020   TSH 1.25 05/26/2020   TSH 10.69 (H) 04/28/2020   TSH 8.24 (H) 01/21/2020   TSH 2.69 07/11/2019   TSH 1.90 08/12/2018   TSH 2.49 06/14/2018   TSH 0.14 (L) 04/11/2018   FREET4 1.51 09/09/2021   FREET4 0.61 10/25/2020   FREET4 0.93 07/22/2020   FREET4 1.10 05/26/2020   FREET4 0.70 04/28/2020   FREET4 1.10 01/21/2020   FREET4 1.33 07/11/2019   FREET4 1.05 08/12/2018   FREET4 0.96 06/14/2018   FREET4 1.22 04/11/2018  08/31/2017: TSH 19.8, Free T3 2.4 (2-4.4): Free T4 1.41 (0.82-1.77) 06/05/2017: TSH 75  Pt denies: - feeling nodules in neck - hoarseness - dysphagia - choking  She describes intentionally losing approximately 20 pounds before she found out she was pregnant.  Lowest adult weight when she was very active: 145 pounds.  This is her goal  She has + FH of thyroid disorders in: father - hypothyroidism. No FH of thyroid cancer. No h/o radiation tx to head or neck. No herbal supplements. No Biotin use. No recent steroids use.   ROS:  + See HPI  I reviewed pt's medications, allergies, PMH, social hx, family hx, and changes were documented in the history of present illness. Otherwise, unchanged from my initial visit note.  Past Medical History:  Diagnosis Date   Hirsutism    Hypertension    Hypothyroidism    Mitral incompetence    Mitral valve prolapse    Pregnancy induced hypertension    Past Surgical History:  Procedure Laterality Date   CESAREAN SECTION     CESAREAN SECTION N/A 11/10/2020   Procedure: CESAREAN SECTION;  Surgeon: Vick Frees, MD;  Location: [facility];  Service: Obstetrics;  Laterality: N/A;   DILATION AND CURETTAGE OF UTERUS     MITRAL VALVE REPAIR  01/29/12   TOTAL  THYROIDECTOMY     Social History   Socioeconomic History   Marital status: Married    Spouse name: Not on file   Number of children: 1 boy - 26 y/o in 2018  Social Needs  Occupational History   counselor  Tobacco Use   Smoking status: Never Smoker   Smokeless tobacco: Never Used  Substance and Sexual Activity   Alcohol use: No   Drug use: No   Sexual activity: Yes    Birth control/protection: IUD   Current Outpatient Medications  Medication Sig Dispense Refill   levothyroxine (SYNTHROID) 112 MCG tablet Take 1 tablet by mouth once daily 90 tablet 0   No current facility-administered medications for this visit.    No Known Allergies   FHL: see HPI  PE: BP 108/76 (BP Location: Left Arm, Patient Position: Sitting, Cuff Size: Normal)   Pulse 80   Ht 5\' 6"  (1.676 m)   Wt 172 lb 12.8 oz (78.4 kg)   SpO2 98%   BMI 27.89 kg/m  Wt Readings from Last 3 Encounters:  09/15/22 172 lb 12.8 oz (78.4 kg)  09/09/21 159 lb 6.4 oz (72.3 kg)  11/12/20 182 lb 8 oz (82.8 kg)   Constitutional: overweight, in NAD Eyes:  EOMI, no exophthalmos ENT: no neck masses, no cervical lymphadenopathy Cardiovascular: RRR, No MRG Respiratory: CTA B Musculoskeletal: no deformities Skin:no rashes, + terminal dark hairs on chin, no acne Neurological: no tremor with outstretched hands  ASSESSMENT: 1.  Postsurgical hypothyroidism   2.  Hirsutism  PLAN:  1. Patient with longstanding, uncontrolled, hypothyroidism, previously noncompliant with her levothyroxine therapy. In summer 2018 she had a TSH of 75 (she was off her levothyroxine then).  She then restarted to take the thyroid medication but skipped doses and was taking it too close to breakfast so her TSH only improved to 19.  When she started to take it consistently, her TSH level improved. -- latest thyroid labs reviewed with pt. >> TSH was suppressed: Lab Results  Component Value Date   TSH 0.25 (L) 09/09/2021  - she continues on LT4 112 mcg  daily - she did not return for labs afterwards - we discussed at this visit that if we end up changing the dose of her levothyroxine today, we have to have her back for labs in 5 to 6 weeks. - pt feels very fatigued, despite having been on replacement with vitamin D, B12, and iron 4 months. - we discussed about taking the thyroid hormone every day, with water, >30 minutes before breakfast, separated by >4 hours from acid reflux medications, calcium, iron, multivitamins.  Upon questioning, however, she is taking iron and multivitamins only 30 minutes after levothyroxine, instead of 4 hours.  We will move these to lunchtime. -  will check thyroid tests in 5 to 6 weeks, after the above change: TSH and fT4 - I we will see her back in a year  2.  Hirsutism -Associated with irregular menstrual cycles -I do not have previous investigation by OB/GYN, but she does not have a formal diagnosis of PCOS -She is currently on oral contraceptives but she does not feel that her hair improved on this -We will check a testosterone level with the next set of labs -We discussed the treatment for PCOS, in case the investigation is positive, is still oral contraceptives.  We can also try spironolactone.  Orders Placed This Encounter  Procedures   Testosterone Free with SHBG   TSH   T4, free   Component     Latest Ref Rng 10/20/2022  TSH     0.35 - 5.50 uIU/mL 1.38   T4,Free(Direct)     0.60 - 1.60 ng/dL 1.85   Testosterone, Serum (Total)     ng/dL 22   % Free Testosterone     % 1.8   Free Testosterone, S     pg/mL 4.0   Sex Hormone Binding Globulin     nmol/L 29.8    All normal.  Carlus Pavlov, MD PhD [facility] Endocrinology

## 2022-09-15 NOTE — Patient Instructions (Addendum)
Please come back for labs in 5-6 weeks.  Continue Levothyroxine 125 mcg daily.  Take the thyroid hormone every day, with water, at least 30 minutes before breakfast, separated by at least 4 hours from: - acid reflux medications - calcium - iron - multivitamins  Please come back for a follow-up appointment in 1 year.

## 2022-10-20 ENCOUNTER — Other Ambulatory Visit: Payer: BC Managed Care – PPO

## 2022-10-20 ENCOUNTER — Other Ambulatory Visit (INDEPENDENT_AMBULATORY_CARE_PROVIDER_SITE_OTHER): Payer: BC Managed Care – PPO

## 2022-10-20 DIAGNOSIS — E890 Postprocedural hypothyroidism: Secondary | ICD-10-CM | POA: Diagnosis not present

## 2022-10-20 DIAGNOSIS — L680 Hirsutism: Secondary | ICD-10-CM

## 2022-10-20 LAB — TSH: TSH: 1.38 u[IU]/mL (ref 0.35–5.50)

## 2022-10-20 LAB — T4, FREE: Free T4: 1.08 ng/dL (ref 0.60–1.60)

## 2022-10-27 ENCOUNTER — Encounter: Payer: Self-pay | Admitting: Internal Medicine

## 2022-10-27 LAB — TESTOSTERONE, FREE AND TOTAL (INCLUDES SHBG)-(MALES)
% Free Testosterone: 1.8 %
Free Testosterone, S: 4 pg/mL
Sex Hormone Binding Globulin: 29.8 nmol/L
Testosterone, Serum (Total): 22 ng/dL

## 2022-12-01 ENCOUNTER — Other Ambulatory Visit: Payer: Self-pay | Admitting: Internal Medicine

## 2022-12-06 ENCOUNTER — Ambulatory Visit
Admission: EM | Admit: 2022-12-06 | Discharge: 2022-12-06 | Disposition: A | Discharge status: Home / Self Care | Payer: BLUE CROSS/BLUE SHIELD | Attending: Internal Medicine | Admitting: Internal Medicine

## 2022-12-06 DIAGNOSIS — J069 Acute upper respiratory infection, unspecified: Secondary | ICD-10-CM | POA: Diagnosis present

## 2022-12-06 DIAGNOSIS — Z20822 Contact with and (suspected) exposure to covid-19: Secondary | ICD-10-CM | POA: Diagnosis present

## 2022-12-06 MED ORDER — BENZONATATE 100 MG PO CAPS: 100.0000 mg | ORAL_CAPSULE | Freq: Three times a day (TID) | ORAL | 0 refills | Status: DC | PRN

## 2022-12-06 MED ORDER — FLUTICASONE PROPIONATE 50 MCG/ACT NA SUSP: 1.0000 | Freq: Every day | NASAL | 0 refills | Status: DC

## 2022-12-06 NOTE — ED Provider Notes (Signed)
[facility]    CSN: [id number] Arrival date & time: 12/06/22  1905      History   Chief Complaint Chief Complaint  Patient presents with   Covid Exposure    HPI Samantha Meyer is a 37 y.o. female.   Patient presents with sore throat, nasal congestion, cough, generalized body aches, chills, sneezing that started approximately 2  days ago.  Patient reports her family member recently tested positive for COVID-19 in her household.  Denies any associated fever at home.  Denies chest pain, shortness of breath, ear pain, nausea, vomiting, diarrhea, abdominal pain.  Patient has taken Dayquil with minimal improvement in symptoms.  Denies history of asthma or COPD and patient does not smoke cigarettes.     Past Medical History:  Diagnosis Date   Hirsutism    Hypertension    Hypothyroidism    Mitral incompetence    Mitral valve prolapse    Pregnancy induced hypertension     Patient Active Problem List   Diagnosis Date Noted   Status post repeat low transverse cesarean section 12/22 11/10/2020   Postpartum care following cesarean delivery 12/22 11/10/2020   Preeclampsia, severe 11/10/2020   Chronic hypertension 11/10/2020   Postsurgical hypothyroidism 11/09/2017    Past Surgical History:  Procedure Laterality Date   CESAREAN SECTION     CESAREAN SECTION N/A 11/10/2020   Procedure: CESAREAN SECTION;  Surgeon: Vick Frees, MD;  Location: [facility];  Service: Obstetrics;  Laterality: N/A;   DILATION AND CURETTAGE OF UTERUS     MITRAL VALVE REPAIR  01/29/12   TOTAL THYROIDECTOMY      OB History     Gravida  3   Para  2   Term  2   Preterm      AB  1   Living  2      SAB  1   IAB      Ectopic      Multiple  0   Live Births  1            Home Medications    Prior to Admission medications   Medication Sig Start Date End Date Taking? Authorizing Provider  benzonatate (TESSALON) 100 MG capsule Take 1 capsule (100 mg total) by  mouth every 8 (eight) hours as needed for cough. 12/06/22  Yes Xzayvier Fagin, Rolly Salter E, FNP  fluticasone (FLONASE) 50 MCG/ACT nasal spray Place 1 spray into both nostrils daily. 12/06/22  Yes Berwyn Bigley, Acie Fredrickson, FNP  aspirin EC (ASPIRIN ADULT LOW DOSE) 81 MG tablet Take 1 capsule by mouth once a week.    [provider]  Cetirizine HCl (ZYRTEC ALLERGY PO) Take by mouth.    [provider]  cyanocobalamin (VITAMIN B12) 1000 MCG tablet Take 1 tablet by mouth daily.    [provider]  Drospirenone (SLYND PO) Slynd 4 mg (28) tablet  Take 1 tablet every day by oral route as directed for 28 days.    [provider]  levothyroxine (SYNTHROID) 112 MCG tablet Take 1 tablet by mouth once daily 12/01/22   Carlus Pavlov, MD    Family History Family History  Problem Relation Age of Onset   Cancer Mother    Heart disease Mother    Cancer Father    Hypertension Maternal Grandmother    Heart disease Maternal Grandmother    Hypertension Maternal Grandfather    Hypertension Paternal Grandmother    Heart disease Paternal Grandmother    Hypertension  Paternal Grandfather     Social History Social History   Tobacco Use   Smoking status: Never   Smokeless tobacco: Never  Vaping Use   Vaping Use: Never used  Substance Use Topics   Alcohol use: No   Drug use: No     Allergies   Patient has no known allergies.   Review of Systems Review of Systems Per HPI  Physical Exam Triage Vital Signs ED Triage Vitals  Enc Vitals Group     BP 12/06/22 1928 119/83     Pulse Rate 12/06/22 1928 86     Resp 12/06/22 1928 20     Temp 12/06/22 1928 98.5 F (36.9 C)     Temp Source 12/06/22 1928 Oral     SpO2 12/06/22 1928 98 %     Weight --      Height --      Head Circumference --      Peak Flow --      Pain Score 12/06/22 1938 4     Pain Loc --      Pain Edu? --      Excl. in [place]? --    No data found.  Updated Vital Signs BP 119/83 (BP Location: Left Arm)   Pulse 86    Temp 98.5 F (36.9 C) (Oral)   Resp 20   SpO2 98%   Breastfeeding No   Visual Acuity Right Eye Distance:   Left Eye Distance:   Bilateral Distance:    Right Eye Near:   Left Eye Near:    Bilateral Near:     Physical Exam Constitutional:      General: She is not in acute distress.    Appearance: Normal appearance. She is not toxic-appearing or diaphoretic.  HENT:     Head: Normocephalic and atraumatic.     Right Ear: Tympanic membrane and ear canal normal.     Left Ear: Tympanic membrane and ear canal normal.     Nose: Congestion present.     Mouth/Throat:     Mouth: Mucous membranes are moist.     Pharynx: No posterior oropharyngeal erythema.  Eyes:     Extraocular Movements: Extraocular movements intact.     Conjunctiva/sclera: Conjunctivae normal.     Pupils: Pupils are equal, round, and reactive to light.  Cardiovascular:     Rate and Rhythm: Normal rate and regular rhythm.     Pulses: Normal pulses.     Heart sounds: Normal heart sounds.  Pulmonary:     Effort: Pulmonary effort is normal. No respiratory distress.     Breath sounds: Normal breath sounds. No stridor. No wheezing, rhonchi or rales.  Abdominal:     General: Abdomen is flat. Bowel sounds are normal.     Palpations: Abdomen is soft.  Musculoskeletal:        General: Normal range of motion.     Cervical back: Normal range of motion.  Skin:    General: Skin is warm and dry.  Neurological:     General: No focal deficit present.     Mental Status: She is alert and oriented to person, place, and time. Mental status is at baseline.  Psychiatric:        Mood and Affect: Mood normal.        Behavior: Behavior normal.      UC Treatments / Results  Labs (all labs ordered are listed, but only abnormal results are displayed) Labs Reviewed  SARS CORONAVIRUS 2 (TAT  6-24 HRS)    EKG   Radiology No results found.  Procedures Procedures (including critical care time)  Medications Ordered in  UC Medications - No data to display  Initial Impression / Assessment and Plan / UC Course  I have reviewed the triage vital signs and the nursing notes.  Pertinent labs & imaging results that were available during my care of the patient were reviewed by me and considered in my medical decision making (see chart for details).     Patient presents with symptoms likely from a viral upper respiratory infection.  Most likely COVID-19 given patient's close exposure.  Do not suspect underlying cardiopulmonary process. Symptoms seem unlikely related to ACS, CHF or COPD exacerbations, pneumonia, pneumothorax. Patient is nontoxic appearing and not in need of emergent medical intervention.  COVID test pending.  Patient declined blood work to test kidney function for Paxlovid.  Recommended symptom control with medications and supportive care.  Patient sent prescriptions.  Return if symptoms fail to improve in 1-2 weeks or you develop shortness of breath, chest pain, severe headache. Patient states understanding and is agreeable.  Discharged with PCP followup.  Final Clinical Impressions(s) / UC Diagnoses   Final diagnoses:  Close exposure to COVID-19 virus  Viral upper respiratory tract infection with cough     Discharge Instructions      You most likely have COVID-19 given your close exposure.  COVID test is pending.  Will call if it is positive.  I have sent in 2 medications to help alleviate symptoms.  Please follow-up if any symptoms persist or worsen.    ED Prescriptions     Medication Sig Dispense Auth. Provider   fluticasone (FLONASE) 50 MCG/ACT nasal spray Place 1 spray into both nostrils daily. 16 g Rowe Warman, Hildred Alamin E, Tice   benzonatate (TESSALON) 100 MG capsule Take 1 capsule (100 mg total) by mouth every 8 (eight) hours as needed for cough. 21 capsule Armstrong, Michele Rockers, [place]      PDMP not reviewed this encounter.   Teodora Medici, [place] 12/06/22 2003

## 2022-12-06 NOTE — ED Triage Notes (Signed)
Pt c/o sore throat, body aches, headache, chills, sneezing,   Report covid exposure in home   Onset ~ Monday

## 2022-12-06 NOTE — Discharge Instructions (Addendum)
You most likely have COVID-19 given your close exposure.  COVID test is pending.  Will call if it is positive.  I have sent in 2 medications to help alleviate symptoms.  Please follow-up if any symptoms persist or worsen.

## 2022-12-07 LAB — SARS CORONAVIRUS 2 (TAT 6-24 HRS): SARS Coronavirus 2: POSITIVE — AB

## 2022-12-08 ENCOUNTER — Telehealth (HOSPITAL_COMMUNITY): Payer: Self-pay | Admitting: Emergency Medicine

## 2022-12-08 MED ORDER — NIRMATRELVIR/RITONAVIR (PAXLOVID)TABLET: 3.0000 | ORAL_TABLET | Freq: Two times a day (BID) | ORAL | 0 refills | Status: AC

## 2022-12-08 NOTE — Telephone Encounter (Signed)
Per Dr. Lanny Cramp, okay for patient to take Paxlovid Reviewed with patient, verified pharmacy, prescription sent

## 2023-03-18 ENCOUNTER — Other Ambulatory Visit: Payer: Self-pay | Admitting: Internal Medicine

## 2023-09-21 ENCOUNTER — Encounter: Payer: Self-pay | Admitting: Internal Medicine

## 2023-09-21 ENCOUNTER — Ambulatory Visit: Payer: BC Managed Care – PPO | Admitting: Internal Medicine

## 2023-09-21 VITALS — BP 118/64 | HR 92 | Ht 66.0 in | Wt 174.4 lb

## 2023-09-21 DIAGNOSIS — E890 Postprocedural hypothyroidism: Secondary | ICD-10-CM

## 2023-09-21 DIAGNOSIS — L680 Hirsutism: Secondary | ICD-10-CM

## 2023-09-21 LAB — TSH: TSH: 2.18 u[IU]/mL (ref 0.35–5.50)

## 2023-09-21 LAB — T4, FREE: Free T4: 1.08 ng/dL (ref 0.60–1.60)

## 2023-09-21 MED ORDER — LEVOTHYROXINE SODIUM 112 MCG PO TABS: 112.0000 ug | ORAL_TABLET | Freq: Every day | ORAL | 3 refills | Status: DC

## 2023-09-21 NOTE — Progress Notes (Signed)
Patient ID: Samantha Meyer, female   DOB: 05-Mar-1986, 37 y.o.   MRN: [id number]   HPI  Samantha Meyer is a 37 y.o.-year-old female-year-old female, initially referred by Dr. Billy Coast, returning for follow-up for postsurgical hypothyroidism.  Last visit 1 year ago.  Interim hx: She feels better, less fatigued at this visit. She came off supplements. She did have 2 episodes of dizziness more recently, when sitting down.  These resolved. She is off the oral contraceptive after husband had a vasectomy.  Menstrual cycles are still irregular, more frequent than once a month.  Reviewed history: Pt. has a history of MVP (dx at 37 y/o) >> repair surgery in 11/2011. She continued to have anxiety, hot flushes, palpitations, dizziness, weight loss, after the sx. 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   She tried oral medications for hyperthyroidism for 2-3 years >> but developed SVT: tachycardia + syncope >> as she had a large goiter then, she was advised to have total thyroidectomy >> had this in 2016.  After her total thyroidectomy >> started on Levothyroxine but she was noncompliant and her endocrinologist left practice >> TFTs fluctuating but mostly very high (see below) >> Dr. Billy Coast referred her to me.   After I started to see her, we discussed at length about possible consequences of uncontrolled hypothyroidism and I recommended that she restarted her levothyroxine (at that time, she was off the medication completely).  We restarted it at 137 mcg daily but we had to decrease the dose to 100 mcg and then to 88 mcg daily after she started to take it consistently.  She found out she was pregnant in 04/2020.  We started to increase the dose of levothyroxine.  She moved it at night and her TSH became uncontrolled again.  I again advised her to move it in the morning.  In 10/2022, we moved multivitamins and iron more than 4 hours after breakfast >> now off.  Pt was on levothyroxine 137 >> 125 >> 112 mcg daily,  taken: - in am - fasting - at least 30-60 min from b'fast - no Ca + Fe - then >4h after b'fast >> stopped - no PPIs + multivitamins - then  >4h after b'fast >> stopped - not on Biotin, no steroids  Reviewed patient's TFTs: Lab Results  Component Value Date   TSH 1.38 10/20/2022   TSH 0.25 (L) 09/09/2021   TSH 1.37 10/25/2020   TSH 0.62 07/22/2020   TSH 1.25 05/26/2020   TSH 10.69 (H) 04/28/2020   TSH 8.24 (H) 01/21/2020   TSH 2.69 07/11/2019   TSH 1.90 08/12/2018   TSH 2.49 06/14/2018   FREET4 1.08 10/20/2022   FREET4 1.51 09/09/2021   FREET4 0.61 10/25/2020   FREET4 0.93 07/22/2020   FREET4 1.10 05/26/2020   FREET4 0.70 04/28/2020   FREET4 1.10 01/21/2020   FREET4 1.33 07/11/2019   FREET4 1.05 08/12/2018   FREET4 0.96 06/14/2018  08/31/2017: TSH 19.8, Free T3 2.4 (2-4.4): Free T4 1.41 (0.82-1.77) 06/05/2017: TSH 75  Pt denies: - feeling nodules in neck - hoarseness - dysphagia - choking  She describes intentionally losing approximately 20 pounds before she found out she was pregnant.  Lowest adult weight when she was very active: 145 pounds.  This is her goal  She has + FH of thyroid disorders in: father - hypothyroidism. No FH of thyroid cancer. No h/o radiation tx to head or neck. No herbal supplements. No Biotin use. No recent steroids use.  She continues to have fatigue. She had investigation with PCP-anemia, low B12 and vitamin D. Now replaced.  She started a multivitamin and iron in 2023. She had a sleep study in 2023 >> no OSA. She still has hirsutism on chin - has to wax 1-2x a week, but tweezes daily.   She has a history of irregular menstrual cycles and was on oral contraceptives but they were not helping too much with her side goals.  She came off birth control in 2024.  ROS:  + See HPI  I reviewed pt's medications, allergies, PMH, social hx, family hx, and changes were documented in the history of present illness. Otherwise, unchanged from my  initial visit note.  Past Medical History:  Diagnosis Date   Hirsutism    Hypertension    Hypothyroidism    Mitral incompetence    Mitral valve prolapse    Pregnancy induced hypertension    Past Surgical History:  Procedure Laterality Date   CESAREAN SECTION     CESAREAN SECTION N/A 11/10/2020   Procedure: CESAREAN SECTION;  Surgeon: Vick Frees, MD;  Location: [facility];  Service: Obstetrics;  Laterality: N/A;   DILATION AND CURETTAGE OF UTERUS     MITRAL VALVE REPAIR  01/29/12   TOTAL THYROIDECTOMY     Social History   Socioeconomic History   Marital status: Married    Spouse name: Not on file   Number of children: 1 boy - 40 y/o in 2018  Social Needs  Occupational History   counselor  Tobacco Use   Smoking status: Never Smoker   Smokeless tobacco: Never Used  Substance and Sexual Activity   Alcohol use: No   Drug use: No   Sexual activity: Yes    Birth control/protection: IUD   Current Outpatient Medications  Medication Sig Dispense Refill   aspirin EC (ASPIRIN ADULT LOW DOSE) 81 MG tablet Take 1 capsule by mouth once a week.     benzonatate (TESSALON) 100 MG capsule Take 1 capsule (100 mg total) by mouth every 8 (eight) hours as needed for cough. 21 capsule 0   Cetirizine HCl (ZYRTEC ALLERGY PO) Take by mouth.     cyanocobalamin (VITAMIN B12) 1000 MCG tablet Take 1 tablet by mouth daily.     Drospirenone (SLYND PO) Slynd 4 mg (28) tablet  Take 1 tablet every day by oral route as directed for 28 days.     fluticasone (FLONASE) 50 MCG/ACT nasal spray Place 1 spray into both nostrils daily. 16 g 0   levothyroxine (SYNTHROID) 112 MCG tablet Take 1 tablet by mouth once daily 90 tablet 1   No current facility-administered medications for this visit.    No Known Allergies   FHL: see HPI  PE: BP 118/64   Pulse 92   Ht 5\' 6"  (1.676 m)   Wt 174 lb 6.4 oz (79.1 kg)   SpO2 95%   BMI 28.15 kg/m  Wt Readings from Last 3 Encounters:  09/21/23 174 lb 6.4 oz  (79.1 kg)  09/15/22 172 lb 12.8 oz (78.4 kg)  09/09/21 159 lb 6.4 oz (72.3 kg)   Constitutional: overweight, in NAD Eyes:  EOMI, no exophthalmos ENT: no neck masses, no cervical lymphadenopathy Cardiovascular: RRR, No MRG Respiratory: CTA B Musculoskeletal: no deformities Skin:no rashes, + terminal dark hairs on chin, no acne Neurological: no tremor with outstretched hands  ASSESSMENT: 1.  Postsurgical hypothyroidism   2.  Hirsutism  PLAN:  1. Patient with longstanding, uncontrolled, hypothyroidism,  previously noncompliant with levothyroxine therapy.  In summer 2018, she was off levothyroxine and the TSH returned very high, at 27.  She then restarted to take the thyroid medication but was skipping doses and was taking it too close to breakfast and her TSH only improved to 19.  When she started to take it consistently and correctly, TSH improved. -latest thyroid labs reviewed with pt. >> normal: Lab Results  Component Value Date   TSH 1.38 10/20/2022  - she continues on LT4 112 mcg daily - pt feels good on this dose, with improved fatigue after being on vitamin D, B12, and iron supplementation.  She now came off all of the supplements.  We discussed that she may need to restart them.  She does describe dizziness, which could be related to anemia, especially as she has increased menstrual bleeding.  I advised her to keep a close eye on the above levels and see if she needed to restart the supplements. - we discussed about taking the thyroid hormone every day, with water, >30 minutes before breakfast, separated by >4 hours from acid reflux medications, calcium, iron, multivitamins. Pt. is taking it correctly now.  She was taking iron and multivitamins only 30 minutes after levothyroxine at last visit, and we  moved these >4 hours later.  She initially did so, but then came off the supplements. - will check thyroid tests today: TSH and fT4 - If labs are abnormal, she will need to return for  repeat TFTs in 1.5 months - OTW, I will see her back in a year  2.  Hirsutism -Associated with irregular menstrual cycles.  -I do not have previous investigation by OB/GYN, but she does not have a formal diagnosis of PCOS -She was on OCP (drospirenone), but she does not feel significant improvement on this.  She is currently off after her husband had a vasectomy.  Discussed that she may need to restart an OCP but possibly another formulation to see if this may help with irregular menstrual cycles and hirsutism.  She agrees with this. -At last visit, testosterone level was normal  Component     Latest Ref Rng 09/21/2023  T4,Free(Direct)     0.60 - 1.60 ng/dL 1.61   TSH     0.96 - 0.45 uIU/mL 2.18   TFTs normal.  Carlus Pavlov, MD PhD [facility] Endocr[facility]

## 2023-09-21 NOTE — Patient Instructions (Addendum)
Please stop at the lab.  Continue Levothyroxine 112 mcg daily.  Take the thyroid hormone every day, with water, at least 30 minutes before breakfast, separated by at least 4 hours from: - acid reflux medications - calcium - iron - multivitamins  Please come back for a follow-up appointment in 1 year.

## 2024-04-11 ENCOUNTER — Ambulatory Visit
Admission: EM | Admit: 2024-04-11 | Discharge: 2024-04-11 | Disposition: A | Discharge status: Home / Self Care | Attending: Family Medicine | Admitting: Family Medicine

## 2024-04-11 DIAGNOSIS — R42 Dizziness and giddiness: Secondary | ICD-10-CM

## 2024-04-11 DIAGNOSIS — R7989 Other specified abnormal findings of blood chemistry: Secondary | ICD-10-CM | POA: Diagnosis not present

## 2024-04-11 DIAGNOSIS — L989 Disorder of the skin and subcutaneous tissue, unspecified: Secondary | ICD-10-CM

## 2024-04-11 DIAGNOSIS — R519 Headache, unspecified: Secondary | ICD-10-CM

## 2024-04-11 DIAGNOSIS — O09529 Supervision of elderly multigravida, unspecified trimester: Secondary | ICD-10-CM | POA: Insufficient documentation

## 2024-04-11 DIAGNOSIS — S2690XA Unspecified injury of heart, unspecified with or without hemopericardium, initial encounter: Secondary | ICD-10-CM | POA: Insufficient documentation

## 2024-04-11 LAB — POC SARS CORONAVIRUS 2 AG -  ED: SARS Coronavirus 2 Ag: NEGATIVE

## 2024-04-11 MED ORDER — KETOROLAC TROMETHAMINE 30 MG/ML IJ SOLN
30.0000 mg | Freq: Once | INTRAMUSCULAR | Status: AC
  Administered 2024-04-11: 30 mg via INTRAMUSCULAR

## 2024-04-11 NOTE — ED Provider Notes (Addendum)
 [facility]    CSN: [id number] Arrival date & time: 04/11/24  1759      History   Chief Complaint Chief Complaint  Patient presents with   Headache   Dizziness   Skin Problem    HPI Samantha Meyer is a 38 y.o. female.    Headache Associated symptoms: dizziness   Dizziness Associated symptoms: headaches    Here for lightheaded dizziness. It began bothering her just a little about a week ago. It was happening occasionally, lasting a few seconds.  Then she began having URI symptoms on 5/19, with cough and congestion. Those symptoms are improving.  Her menstrual cycle came on about 5/18, and she felt worse. It is lightening up, and the cramps have improved. She is having a h/a that is unlike her migraines, and she is tired  The dizziness has worsened and is lasting longer. No vertigo. No n/v/d. No f/c. No falls.  Chart reviewed in care everywhere: she has had mild anemia, low B12 and low vit D in the past.  Also she has what she calls a scar that developed a couple of months ago on her right upper quadrant.  It has enlarged in size and is not really hurting.  She does not have a primary care provider at this time.   Past Medical History:  Diagnosis Date   Hirsutism    Hypertension    Hypothyroidism    Mitral incompetence    Mitral valve prolapse    Pregnancy induced hypertension     Patient Active Problem List   Diagnosis Date Noted   Fetal growth restriction with weight unknown 04/11/2024   Trauma to mitral valve 04/11/2024   Multigravida of advanced maternal age 79/23/2025   At risk for sleep apnea 05/26/2022   Snoring 05/26/2022   Status post repeat low transverse cesarean section 12/22 11/10/2020   Postpartum care following cesarean delivery 12/22 11/10/2020   Preeclampsia, severe 11/10/2020   Chronic hypertension 11/10/2020   Pre-existing essential hypertension complicating pregnancy, third trimester 10/28/2020   Pregnancy 10/05/2020    Acquired hypothyroidism 05/18/2020   History of mitral valve repair 04/16/2019   History of PSVT (paroxysmal supraventricular tachycardia) 04/16/2019   Postsurgical hypothyroidism 11/09/2017   Mitral valve disorder 05/26/2015   SVT (supraventricular tachycardia) (HCC) 05/26/2015   Syncope 05/26/2015    Past Surgical History:  Procedure Laterality Date   CESAREAN SECTION     CESAREAN SECTION N/A 11/10/2020   Procedure: CESAREAN SECTION;  Surgeon: Zora Hires, MD;  Location: [facility];  Service: Obstetrics;  Laterality: N/A;   DILATION AND CURETTAGE OF UTERUS     MITRAL VALVE REPAIR  01/29/12   TOTAL THYROIDECTOMY      OB History     Gravida  3   Para  2   Term  2   Preterm      AB  1   Living  2      SAB  1   IAB      Ectopic      Multiple  0   Live Births  1            Home Medications    Prior to Admission medications   Medication Sig Start Date End Date Taking? Authorizing Provider  cetirizine (ZYRTEC) 10 MG tablet Take 10 mg by mouth daily. 05/26/22  Yes [provider]  Ergocalciferol 10 MCG (400 UNIT) TABS Take 2,000 Units by mouth daily at 2 PM. 05/26/22  Yes [provider]  ferrous sulfate 325 (65 FE) MG tablet Take 325 mg by mouth daily with breakfast. 05/26/22  Yes [provider]  levothyroxine  (SYNTHROID ) 112 MCG tablet Take 1 tablet (112 mcg total) by mouth daily. 09/21/23  Yes Emilie Harden, MD  metoprolol succinate (TOPROL-XL) 25 MG 24 hr tablet Take 25 mg by mouth daily. 02/01/22  Yes [provider]  nitrofurantoin, macrocrystal-monohydrate, (MACROBID) 100 MG capsule Take 100 mg by mouth 2 (two) times daily. 11/21/23  Yes [provider]  Sod Picosulfate-Mag Ox-Cit Acd (CLENPIQ) 10-3.5-12 MG-GM -GM/175ML SOLN Take 175 mLs by mouth 2 (two) times daily. 08/03/22  Yes [provider]  amoxicillin (AMOXIL) 500 MG tablet Take 500 mg by mouth 2 (two) times daily.    [provider]   aspirin EC (ASPIRIN ADULT LOW DOSE) 81 MG tablet Take 1 capsule by mouth once a week. Patient not taking: Reported on 09/21/2023    [provider]  benzonatate  (TESSALON ) 100 MG capsule Take 1 capsule (100 mg total) by mouth every 8 (eight) hours as needed for cough. Patient not taking: Reported on 09/21/2023 12/06/22   Dodson Freestone, FNP  Cetirizine HCl (ZYRTEC ALLERGY PO) Take by mouth.    [provider]  cyanocobalamin (VITAMIN B12) 1000 MCG tablet Take 1 tablet by mouth daily. Patient not taking: Reported on 09/21/2023    [provider]  Drospirenone (SLYND PO) Slynd 4 mg (28) tablet  Take 1 tablet every day by oral route as directed for 28 days. Patient not taking: Reported on 09/21/2023    [provider]  fluticasone  (FLONASE ) 50 MCG/ACT nasal spray Place 1 spray into both nostrils daily. Patient not taking: Reported on 09/21/2023 12/06/22   Dodson Freestone, FNP    Family History Family History  Problem Relation Age of Onset   Cancer Mother    Heart disease Mother    Cancer Father    Hypertension Maternal Grandmother    Heart disease Maternal Grandmother    Hypertension Maternal Grandfather    Hypertension Paternal Grandmother    Heart disease Paternal Grandmother    Hypertension Paternal Grandfather     Social History Social History   Tobacco Use   Smoking status: Never   Smokeless tobacco: Never  Vaping Use   Vaping status: Never Used  Substance Use Topics   Alcohol use: Yes    Comment: 1-2x every 3mos.   Drug use: Never     Allergies   Patient has no known allergies.   Review of Systems Review of Systems  Neurological:  Positive for dizziness and headaches.     Physical Exam Triage Vital Signs ED Triage Vitals  Encounter Vitals Group     BP 04/11/24 1825 120/81     Systolic BP Percentile --      Diastolic BP Percentile --      Pulse Rate 04/11/24 1825 78     Resp 04/11/24 1825 18     Temp 04/11/24 1825 97.6 F  (36.4 C)     Temp Source 04/11/24 1825 Oral     SpO2 04/11/24 1825 99 %     Weight 04/11/24 1823 170 lb (77.1 kg)     Height 04/11/24 1823 5\' 6"  (1.676 m)     Head Circumference --      Peak Flow --      Pain Score 04/11/24 1819 5     Pain Loc --      Pain Education --  Exclude from Growth Chart --    No data found.  Updated Vital Signs BP 120/81 (BP Location: Right Arm)   Pulse 78   Temp 97.6 F (36.4 C) (Oral)   Resp 18   Ht 5\' 6"  (1.676 m)   Wt 77.1 kg   LMP 04/07/2024 (Exact Date)   SpO2 99%   BMI 27.44 kg/m   Visual Acuity Right Eye Distance:   Left Eye Distance:   Bilateral Distance:    Right Eye Near:   Left Eye Near:    Bilateral Near:     Physical Exam Vitals reviewed.  Constitutional:      General: She is not in acute distress.    Appearance: She is not toxic-appearing.  HENT:     Right Ear: Tympanic membrane and ear canal normal.     Left Ear: Tympanic membrane and ear canal normal.     Nose: Congestion present.     Mouth/Throat:     Mouth: Mucous membranes are moist.     Pharynx: No oropharyngeal exudate or posterior oropharyngeal erythema.  Eyes:     Extraocular Movements: Extraocular movements intact.     Conjunctiva/sclera: Conjunctivae normal.     Pupils: Pupils are equal, round, and reactive to light.  Cardiovascular:     Rate and Rhythm: Normal rate and regular rhythm.     Heart sounds: No murmur heard. Pulmonary:     Effort: Pulmonary effort is normal. No respiratory distress.     Breath sounds: No stridor. No wheezing, rhonchi or rales.  Abdominal:     Palpations: Abdomen is soft.     Tenderness: There is no abdominal tenderness.  Musculoskeletal:     Cervical back: Neck supple.  Lymphadenopathy:     Cervical: No cervical adenopathy.  Skin:    Capillary Refill: Capillary refill takes less than 2 seconds.     Coloration: Skin is not jaundiced or pale.     Comments: There is an area with slightly raised brown discoloration  that is irregular and about 6 cm x 4 cm.  It is not entirely confluent and feels smooth in places, almost like an old scar.  Neurological:     General: No focal deficit present.     Mental Status: She is alert and oriented to person, place, and time.  Psychiatric:        Behavior: Behavior normal.      UC Treatments / Results  Labs (all labs ordered are listed, but only abnormal results are displayed) Labs Reviewed  POC SARS CORONAVIRUS 2 AG -  ED - Normal  CBC  COMPREHENSIVE METABOLIC PANEL WITH GFR  TSH  VITAMIN B12  VITAMIN D 25 HYDROXY (VIT D DEFICIENCY, FRACTURES)    EKG   Radiology No results found.  Procedures Procedures (including critical care time)  Medications Ordered in UC Medications  ketorolac  (TORADOL ) 30 MG/ML injection 30 mg (has no administration in time range)    Initial Impression / Assessment and Plan / UC Course  I have reviewed the triage vital signs and the nursing notes.  Pertinent labs & imaging results that were available during my care of the patient were reviewed by me and considered in my medical decision making (see chart for details).     COVID test is negative.  Lab was drawn to check her blood counts, CMP, vitamin D, vitamin B12, and TSH.  Will notify her if anything significantly abnormal.  Toradol  was given for headache here.  Staff has  helped her make a primary care appointment.  The skin abnormality on her abdomen does not appear to be a cellulitis.  She is given contact information for dermatology Final Clinical Impressions(s) / UC Diagnoses   Final diagnoses:  Dizziness  Low vitamin D level  Low vitamin B12 level  Nonintractable headache, unspecified chronicity pattern, unspecified headache type  Skin lesion     Discharge Instructions      Your COVID test is negative.  You have been given a shot of Toradol  30 mg today.  We have drawn blood to check your blood counts, electrolytes and kidney and liver function  and sugar, thyroid  function, B12, and vitamin D.  Our staff will notify of anything significantly abnormal.     ED Prescriptions   None    PDMP not reviewed this encounter.   Ann Keto, MD 04/11/24 1931    Ann Keto, MD 04/11/24 [phone]

## 2024-04-11 NOTE — ED Triage Notes (Signed)
"  Also I have this really bad scar on the upper right side of my abd I would like checked out in case it is related".

## 2024-04-11 NOTE — Discharge Instructions (Signed)
 Your COVID test is negative.  You have been given a shot of Toradol  30 mg today.  We have drawn blood to check your blood counts, electrolytes and kidney and liver function and sugar, thyroid  function, B12, and vitamin D.  Our staff will notify of anything significantly abnormal.

## 2024-04-11 NOTE — ED Triage Notes (Signed)
"  This started last week with a few moments (seconds) of feeling dizzy". "2 long trips in the last month where I have gotten sick as a passenger in riding, always traveling for work". "This week I started my menses on Monday and this time bad/irregular menses (as always) with a cold and light-headed episode again". "Today I have had a ha (history of migraines but not the same), still light-headed (no room spinning), feeling unsteady".

## 2024-04-12 LAB — CBC
Hematocrit: 33.9 % — ABNORMAL LOW (ref 34.0–46.6)
Hemoglobin: 10.9 g/dL — ABNORMAL LOW (ref 11.1–15.9)
MCH: 31.3 pg (ref 26.6–33.0)
MCHC: 32.2 g/dL (ref 31.5–35.7)
MCV: 97 fL (ref 79–97)
Platelets: 367 10*3/uL (ref 150–450)
RBC: 3.48 x10E6/uL — ABNORMAL LOW (ref 3.77–5.28)
RDW: 12 % (ref 11.7–15.4)
WBC: 8.4 10*3/uL (ref 3.4–10.8)

## 2024-04-12 LAB — COMPREHENSIVE METABOLIC PANEL WITH GFR
ALT: 6 IU/L (ref 0–32)
AST: 16 IU/L (ref 0–40)
Albumin: 4.2 g/dL (ref 3.9–4.9)
Alkaline Phosphatase: 88 IU/L (ref 44–121)
BUN/Creatinine Ratio: 10 (ref 9–23)
BUN: 7 mg/dL (ref 6–20)
Bilirubin Total: <0.2 mg/dL (ref 0.0–1.2)
CO2: 18 mmol/L — ABNORMAL LOW (ref 20–29)
Calcium: 8.8 mg/dL (ref 8.7–10.2)
Chloride: 103 mmol/L (ref 96–106)
Creatinine, Ser: 0.67 mg/dL (ref 0.57–1.00)
Globulin, Total: 3 g/dL (ref 1.5–4.5)
Glucose: 89 mg/dL (ref 70–99)
Potassium: 3.9 mmol/L (ref 3.5–5.2)
Sodium: 139 mmol/L (ref 134–144)
Total Protein: 7.2 g/dL (ref 6.0–8.5)
eGFR: 115 mL/min/{1.73_m2} (ref >59)

## 2024-04-12 LAB — VITAMIN D 25 HYDROXY (VIT D DEFICIENCY, FRACTURES)

## 2024-04-12 LAB — VITAMIN B12

## 2024-04-12 LAB — TSH

## 2024-04-14 LAB — VITAMIN D 25 HYDROXY (VIT D DEFICIENCY, FRACTURES)

## 2024-04-14 LAB — TSH

## 2024-04-14 LAB — VITAMIN B12

## 2024-04-14 LAB — SPECIMEN STATUS REPORT

## 2024-04-15 ENCOUNTER — Ambulatory Visit: Payer: Self-pay

## 2024-04-16 ENCOUNTER — Ambulatory Visit (INDEPENDENT_AMBULATORY_CARE_PROVIDER_SITE_OTHER): Admitting: Family Medicine

## 2024-04-16 ENCOUNTER — Encounter: Payer: Self-pay | Admitting: Family Medicine

## 2024-04-16 VITALS — BP 117/84 | HR 103 | Temp 98.8°F | Ht 66.0 in | Wt 175.0 lb

## 2024-04-16 DIAGNOSIS — R051 Acute cough: Secondary | ICD-10-CM | POA: Insufficient documentation

## 2024-04-16 DIAGNOSIS — R42 Dizziness and giddiness: Secondary | ICD-10-CM | POA: Insufficient documentation

## 2024-04-16 DIAGNOSIS — Z7689 Persons encountering health services in other specified circumstances: Secondary | ICD-10-CM | POA: Diagnosis not present

## 2024-04-16 MED ORDER — HYDROCOD POLI-CHLORPHE POLI ER 10-8 MG/5ML PO SUER: 5.0000 mL | Freq: Every evening | ORAL | 0 refills | Status: AC | PRN

## 2024-04-16 NOTE — Progress Notes (Signed)
 New Patient Office Visit  Subjective    Patient ID: Samantha Meyer, female    DOB: [birth year]  Age: 38 y.o. MRN: [id number]  CC:  Chief Complaint  Patient presents with   Establish Care    Patient was seen at Urgent Care last week for HA, congestion and lightheadedness. Covid negative. She states they ran other labs to but unsure what.   Dizziness   Headache    HPI Ahley E Meyer presents to establish care with this practice. She is new to me. Symptoms started on Monday last week with viral illness.  Reports lightheadedness, dry cough, nasal drainage. Cough is keeping her awake.  No fever, no shortness of breath.  Covid was negative at urgent care.   Seen at urgent care on 04/11/24: Labs canceled for unknown reason at urgent care. Covid test was negative. Continues to have lightheadedness. Describes it as feeling "out of it" Able to drive and walk.   Chart review: 04/11/24:  SARS: negative CBC: hemoglobin 10.9 Hematocrit 33.9  CMP: GFR 115 Potassium: 3.9     Outpatient Encounter Medications as of 04/16/2024  Medication Sig   cetirizine (ZYRTEC) 10 MG tablet Take 10 mg by mouth daily.   chlorpheniramine-HYDROcodone  (TUSSIONEX) 10-8 MG/5ML Take 5 mLs by mouth at bedtime as needed for cough.   Ergocalciferol 10 MCG (400 UNIT) TABS Take 2,000 Units by mouth daily at 2 PM.   ferrous sulfate 325 (65 FE) MG tablet Take 325 mg by mouth daily with breakfast.   levothyroxine  (SYNTHROID ) 112 MCG tablet Take 1 tablet (112 mcg total) by mouth daily.   metoprolol succinate (TOPROL-XL) 25 MG 24 hr tablet Take 25 mg by mouth daily.   Sod Picosulfate-Mag Ox-Cit Acd (CLENPIQ) 10-3.5-12 MG-GM -GM/175ML SOLN Take 175 mLs by mouth 2 (two) times daily.   amoxicillin (AMOXIL) 500 MG tablet Take 500 mg by mouth 2 (two) times daily.   aspirin EC (ASPIRIN ADULT LOW DOSE) 81 MG tablet Take 1 capsule by mouth once a week. (Patient not taking: Reported on 04/16/2024)   benzonatate   (TESSALON ) 100 MG capsule Take 1 capsule (100 mg total) by mouth every 8 (eight) hours as needed for cough. (Patient not taking: Reported on 04/16/2024)   Cetirizine HCl (ZYRTEC ALLERGY PO) Take by mouth. (Patient not taking: Reported on 04/16/2024)   cyanocobalamin (VITAMIN B12) 1000 MCG tablet Take 1 tablet by mouth daily. (Patient not taking: Reported on 04/16/2024)   Drospirenone (SLYND PO) Slynd 4 mg (28) tablet  Take 1 tablet every day by oral route as directed for 28 days. (Patient not taking: Reported on 04/16/2024)   fluticasone  (FLONASE ) 50 MCG/ACT nasal spray Place 1 spray into both nostrils daily. (Patient not taking: Reported on 04/16/2024)   nitrofurantoin, macrocrystal-monohydrate, (MACROBID) 100 MG capsule Take 100 mg by mouth 2 (two) times daily. (Patient not taking: Reported on 04/16/2024)   No facility-administered encounter medications on file as of 04/16/2024.    Past Medical History:  Diagnosis Date   Hirsutism    Hypertension    Hypothyroidism    Mitral incompetence    Mitral valve prolapse    Pregnancy induced hypertension     Past Surgical History:  Procedure Laterality Date   CESAREAN SECTION     CESAREAN SECTION N/A 11/10/2020   Procedure: CESAREAN SECTION;  Surgeon: Zora Hires, MD;  Location: [facility];  Service: Obstetrics;  Laterality: N/A;   DILATION AND CURETTAGE OF UTERUS     MITRAL VALVE REPAIR  01/29/12   TOTAL THYROIDECTOMY      Family History  Problem Relation Age of Onset   Cancer Mother    Heart disease Mother    Cancer Father    Hypertension Maternal Grandmother    Heart disease Maternal Grandmother    Hypertension Maternal Grandfather    Hypertension Paternal Grandmother    Heart disease Paternal Grandmother    Hypertension Paternal Grandfather     Social History   Socioeconomic History   Marital status: Married    Spouse name: Not on file   Number of children: Not on file   Years of education: Not on file   Highest education  level: Not on file  Occupational History   Not on file  Tobacco Use   Smoking status: Never   Smokeless tobacco: Never  Vaping Use   Vaping status: Never Used  Substance and Sexual Activity   Alcohol use: Yes    Comment: 1-2x every 3mos.   Drug use: Never   Sexual activity: Not Currently  Other Topics Concern   Not on file  Social History Narrative   Not on file   Social Drivers of Health   Financial Resource Strain: Low Risk  (04/14/2022)   Received from [facility]   Overall Financial Resource Strain (CARDIA)  Food Insecurity: No Food Insecurity (04/16/2024)   Hunger Vital Sign    Worried About Running Out of Food in the Last Year: Never true    Ran Out of Food in the Last Year: Never true  Transportation Needs: No Transportation Needs (04/16/2024)   PRAPARE - Administrator, Civil Service (Medical): No    Lack of Transportation (Non-Medical): No  Physical Activity: Insufficiently Active (04/14/2022)   Received from [facility] visits prior to 01/20/2023., [facility] visits prior to 01/20/2023.   Exercise Vital Sign    Days of Exercise per Week: 1 day    Minutes of Exercise per Session: 30 min  Stress: Stress Concern Present (04/14/2022)   Received from [facility] - Occupational Stress Questionnaire  Social Connections: Moderately Integrated (04/16/2024)   Social Connection and Isolation Panel [NHANES]    Frequency of Communication with Friends and Family: Three times a week    Frequency of Social Gatherings with Friends and Family: Once a week    Attends Religious Services: Never    Database administrator or Organizations: Yes    Attends Banker Meetings: 1 to 4 times per year    Marital Status: Married  Catering manager Violence: Not At Risk (04/16/2024)   Humiliation, Afraid, Rape, and Kick questionnaire    Fear of Current or  Ex-Partner: No    Emotionally Abused: No    Physically Abused: No    Sexually Abused: No    Review of Systems  Constitutional:  Negative for fever.  HENT:  Positive for congestion.   Respiratory:  Positive for cough. Negative for shortness of breath.         Objective    BP 117/84 (BP Location: Left Arm, Patient Position: Sitting, Cuff Size: Large)   Pulse (!) 103   Temp 98.8 F (37.1 C) (Oral)   Ht 5\' 6"  (1.676 m)   Wt 175 lb (79.4 kg)   LMP 04/07/2024 (Exact Date)   SpO2 100%   BMI 28.25 kg/m   Physical Exam Vitals and nursing note reviewed.  Constitutional:      General: She is not in acute distress.    Appearance: Normal appearance. She is ill-appearing.  Pulmonary:     Effort: Pulmonary effort is normal.     Breath sounds: Normal breath sounds.  Skin:    General: Skin is warm and dry.  Neurological:     General: No focal deficit present.     Mental Status: She is alert. Mental status is at baseline.  Psychiatric:        Mood and Affect: Mood normal.        Behavior: Behavior normal.        Thought Content: Thought content normal.        Judgment: Judgment normal.        Assessment & Plan:   Problem List Items Addressed This Visit     Establishing care with new doctor, encounter for - Primary   Intermittent lightheadedness   Reports lightheadedness that she describes as "feeling out of it" She is able to drive and walk. Denies vertigo. Symptoms started with current viral illness. She was negative for Covid at the urgent care.  Labs from urgent care were canceled for some unknown reason. Will get today. CBC showed anemia, improved from previous.  Discussed it is likely that the feeling she is having is related to current viral illness.       Relevant Orders   B12 and Folate Panel   Iron , TIBC and Ferritin Panel   TSH + free T4   VITAMIN D 25 Hydroxy (Vit-D Deficiency, Fractures)   Acute cough   Dry cough and congestion associated with  current viral illness. Lungs are clear.  Oxygen saturation 100% No shortness of breath. Supportive therapy. Elevating to sleep. Warm liquids with lemon and honey. Tussionex 5 mls at bedtime as needed. PDMP reviewed. No red flags.  Follow-up if symptoms do not resolve.        Relevant Medications   chlorpheniramine-HYDROcodone  (TUSSIONEX) 10-8 MG/5ML  Agrees with plan of care discussed.  Questions answered.   Return if symptoms worsen or fail to improve.   Mickiel Albany, FNP

## 2024-04-16 NOTE — Assessment & Plan Note (Addendum)
 Reports lightheadedness that she describes as "feeling out of it" She is able to drive and walk. Denies vertigo. Symptoms started with current viral illness. She was negative for Covid at the urgent care.  Labs from urgent care were canceled for some unknown reason. Will get today. CBC showed anemia, improved from previous.  Discussed it is likely that the feeling she is having is related to current viral illness.

## 2024-04-16 NOTE — Assessment & Plan Note (Signed)
 Dry cough and congestion associated with current viral illness. Lungs are clear.  Oxygen saturation 100% No shortness of breath. Supportive therapy. Elevating to sleep. Warm liquids with lemon and honey. Tussionex 5 mls at bedtime as needed. PDMP reviewed. No red flags.  Follow-up if symptoms do not resolve.

## 2024-09-22 ENCOUNTER — Ambulatory Visit: Payer: BC Managed Care – PPO | Admitting: Internal Medicine

## 2024-09-22 ENCOUNTER — Other Ambulatory Visit

## 2024-09-22 ENCOUNTER — Encounter: Payer: Self-pay | Admitting: Internal Medicine

## 2024-09-22 VITALS — BP 120/70 | HR 81 | Ht 66.0 in | Wt 178.6 lb

## 2024-09-22 DIAGNOSIS — E890 Postprocedural hypothyroidism: Secondary | ICD-10-CM

## 2024-09-22 DIAGNOSIS — L680 Hirsutism: Secondary | ICD-10-CM | POA: Diagnosis not present

## 2024-09-22 NOTE — Patient Instructions (Signed)
 Please stop at the lab.  Continue Levothyroxine 112 mcg daily.  Take the thyroid hormone every day, with water, at least 30 minutes before breakfast, separated by at least 4 hours from: - acid reflux medications - calcium - iron - multivitamins  Please come back for a follow-up appointment in 1 year.

## 2024-09-22 NOTE — Progress Notes (Signed)
 Patient ID: Samantha Meyer, female   DOB: 05/15/1986, 38 y.o.   MRN: [id number]   HPI  Samantha Meyer is a 38 y.o.-year-old female, initially referred by Dr. Gorge, returning for follow-up for postsurgical hypothyroidism.  Last visit 1 year ago.  Interim hx: She came off the oral contraceptive after husband had a vasectomy.   Menstrual cycles are regular at last visit, but they are more painful.  She did call her OB/GYN doctor to schedule a new appointment. She does have chronic cold intolerance, she had some weight gain after the birth of her child in 2022, but otherwise no increased fatigue, constipation, hair loss.  Reviewed history: Pt. has a history of MVP (dx at 38 y/o) >> repair surgery in 11/2011. She continued to have anxiety, hot flushes, palpitations, dizziness, weight loss, after the sx. 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   She tried oral medications for hyperthyroidism for 2-3 years >> but developed SVT: tachycardia + syncope >> as she had a large goiter then, she was advised to have total thyroidectomy >> had this in 2016.  After her total thyroidectomy >> started on Levothyroxine  but she was noncompliant and her endocrinologist left practice >> TFTs fluctuating but mostly very high (see below) >> Dr. Gorge referred her to me.   After I started to see her, we discussed at length about possible consequences of uncontrolled hypothyroidism and I recommended that she restarted her levothyroxine  (at that time, she was off the medication completely).  We restarted it at 137 mcg daily but we had to decrease the dose to 100 mcg and then to 88 mcg daily after she started to take it consistently.  She found out she was pregnant in 04/2020.  We started to increase the dose of levothyroxine .  She moved it at night and her TSH became uncontrolled again.  I again advised her to move it in the morning.  In 10/2022, we moved multivitamins and iron  more than 4 hours after breakfast >>  now off.  Pt was on levothyroxine  137 >> 125 >> 112 mcg daily, taken: - in am - fasting - at least 30-60 min from b'fast - no Ca - prev. On Fe >4h after b'fast >> stopped - no PPIs - prev. on multivitamins >4h after b'fast >> stopped - not on Biotin, no steroids  Reviewed patient's TFTs: Lab Results  Component Value Date   TSH CANCELED 04/11/2024   TSH CANCELED 04/11/2024   TSH 2.18 09/21/2023   TSH 1.38 10/20/2022   TSH 0.25 (L) 09/09/2021   TSH 1.37 10/25/2020   TSH 0.62 07/22/2020   TSH 1.25 05/26/2020   TSH 10.69 (H) 04/28/2020   TSH 8.24 (H) 01/21/2020   FREET4 1.08 09/21/2023   FREET4 1.08 10/20/2022   FREET4 1.51 09/09/2021   FREET4 0.61 10/25/2020   FREET4 0.93 07/22/2020   FREET4 1.10 05/26/2020   FREET4 0.70 04/28/2020   FREET4 1.10 01/21/2020   FREET4 1.33 07/11/2019   FREET4 1.05 08/12/2018  08/31/2017: TSH 19.8, Free T3 2.4 (2-4.4): Free T4 1.41 (0.82-1.77) 06/05/2017: TSH 75  Pt denies: - feeling nodules in neck - hoarseness - dysphagia - choking  She describes intentionally losing approximately 20 pounds before she found out she was pregnant.  Lowest adult weight when she was very active: 145 pounds.  This is her goal  She has + FH of thyroid  disorders in: father - hypothyroidism. No FH of thyroid  cancer. No h/o radiation tx to head or  neck. No herbal supplements. No Biotin use. No recent steroids use.   She had investigation for fatigue by her PCP-anemia, low B12 and vitamin D . Now replaced.  She started a multivitamin and iron  in 2023.  She is off the supplements now, though She had a sleep study in 2023 >> no OSA. She still has hirsutism on chin - has to wax 1-2x a week, but tweezes it daily. She has a history of irregular menstrual cycles and was on oral contraceptives but they were not helping too much with her side goals.  She came off birth control in 2024.  ROS:  + See HPI  I reviewed pt's medications, allergies, PMH, social hx, family  hx, and changes were documented in the history of present illness. Otherwise, unchanged from my initial visit note.  Past Medical History:  Diagnosis Date   Hirsutism    Hypertension    Hypothyroidism    Mitral incompetence    Mitral valve prolapse    Pregnancy induced hypertension    Past Surgical History:  Procedure Laterality Date   CESAREAN SECTION     CESAREAN SECTION N/A 11/10/2020   Procedure: CESAREAN SECTION;  Surgeon: Linnell Devere BRAVO, MD;  Location: [facility];  Service: Obstetrics;  Laterality: N/A;   DILATION AND CURETTAGE OF UTERUS     MITRAL VALVE REPAIR  01/29/12   TOTAL THYROIDECTOMY     Social History   Socioeconomic History   Marital status: Married    Spouse name: Not on file   Number of children: 1 boy - 64 y/o in 2018  Social Needs  Occupational History   counselor  Tobacco Use   Smoking status: Never Smoker   Smokeless tobacco: Never Used  Substance and Sexual Activity   Alcohol use: No   Drug use: No   Sexual activity: Yes    Birth control/protection: IUD   Current Outpatient Medications  Medication Sig Dispense Refill   amoxicillin (AMOXIL) 500 MG tablet Take 500 mg by mouth 2 (two) times daily.     aspirin EC (ASPIRIN ADULT LOW DOSE) 81 MG tablet Take 1 capsule by mouth once a week. (Patient not taking: Reported on 04/16/2024)     benzonatate  (TESSALON ) 100 MG capsule Take 1 capsule (100 mg total) by mouth every 8 (eight) hours as needed for cough. (Patient not taking: Reported on 04/16/2024) 21 capsule 0   cetirizine (ZYRTEC) 10 MG tablet Take 10 mg by mouth daily.     Cetirizine HCl (ZYRTEC ALLERGY PO) Take by mouth. (Patient not taking: Reported on 04/16/2024)     chlorpheniramine-HYDROcodone  (TUSSIONEX) 10-8 MG/5ML Take 5 mLs by mouth at bedtime as needed for cough. 35 mL 0   cyanocobalamin (VITAMIN B12) 1000 MCG tablet Take 1 tablet by mouth daily. (Patient not taking: Reported on 04/16/2024)     Drospirenone (SLYND PO) Slynd 4 mg (28) tablet   Take 1 tablet every day by oral route as directed for 28 days. (Patient not taking: Reported on 04/16/2024)     Ergocalciferol  10 MCG (400 UNIT) TABS Take 2,000 Units by mouth daily at 2 PM.     ferrous sulfate 325 (65 FE) MG tablet Take 325 mg by mouth daily with breakfast.     fluticasone  (FLONASE ) 50 MCG/ACT nasal spray Place 1 spray into both nostrils daily. (Patient not taking: Reported on 04/16/2024) 16 g 0   levothyroxine  (SYNTHROID ) 112 MCG tablet Take 1 tablet (112 mcg total) by mouth daily. 90 tablet 3  metoprolol succinate (TOPROL-XL) 25 MG 24 hr tablet Take 25 mg by mouth daily.     nitrofurantoin, macrocrystal-monohydrate, (MACROBID) 100 MG capsule Take 100 mg by mouth 2 (two) times daily. (Patient not taking: Reported on 04/16/2024)     Sod Picosulfate-Mag Ox-Cit Acd (CLENPIQ) 10-3.5-12 MG-GM -GM/175ML SOLN Take 175 mLs by mouth 2 (two) times daily.     No current facility-administered medications for this visit.    No Known Allergies   FHL: see HPI  PE: BP 120/70   Pulse 81   Ht 5' 6 (1.676 m)   Wt 178 lb 9.6 oz (81 kg)   SpO2 93%   BMI 28.83 kg/m  Wt Readings from Last 3 Encounters:  09/22/24 178 lb 9.6 oz (81 kg)  04/16/24 175 lb (79.4 kg)  04/11/24 170 lb (77.1 kg)   Constitutional: overweight, in NAD Eyes:  EOMI, no exophthalmos ENT: no neck masses, no cervical lymphadenopathy Cardiovascular: RRR, No MRG Respiratory: CTA B Musculoskeletal: no deformities Skin:no rashes, + terminal dark hairs on chin, no acne Neurological: no tremor with outstretched hands  ASSESSMENT: 1.  Postsurgical hypothyroidism   2.  Hirsutism  PLAN:  1. Patient with longstanding, uncontrolled, hypothyroidism, previously noncompliant with levothyroxine  therapy.  In summer 2018, she was off levothyroxine  and a TSH returned very high, at 75.  She then restarted to take the thyroid  medication but was skipping doses and was taking it too close to breakfast and TSH only improved to 19.   TFTs finally normalized after she started to take this consistently and correctly. -latest thyroid  labs reviewed with pt. >> normal 09/2023 - she continues on LT4 112 mcg daily - pt feels good on this dose.  She previously had fatigue but this improved on vitamin D , B12, and iron  supplementation.  At last visit she was off the supplements.  She did describe dizziness, which could be related to anemia especially as she also had increased menstrual bleeding.  We discussed that she may need to go back on the previous supplements. - we discussed about taking the thyroid  hormone every day, with water, >30 minutes before breakfast, separated by >4 hours from acid reflux medications, calcium, iron , multivitamins. Pt. is taking it correctly. - will check thyroid  tests today: TSH and fT4 - If labs are abnormal, she will need to return for repeat TFTs in 1.5 months - OTW, I will see her back in a year  2.  Hirsutism - She reports irregular menstrual cycles - I do not have previous investigation by OB/GYN, but she does not have a formal diagnosis of PCOS - She was on OCP (drospirenone), but she did not have significant improvement on this.  She is currently off after her husband had a vasectomy.  We previously discussed that she may need to restart an OCP or possibly another agent to see if this may help with irregular menstrual cycles and hirsutism.  She agreed with this.  Further management per OB/GYN. - At last check, testosterone  level was normal.  Needs refills.  Orders Placed This Encounter  Procedures   TSH   T4, free   Lela Fendt, MD PhD [facility] End[facility]

## 2024-09-23 ENCOUNTER — Ambulatory Visit: Payer: Self-pay | Admitting: Internal Medicine

## 2024-09-23 LAB — TSH: TSH: 0.46 m[IU]/L

## 2024-09-23 LAB — T4, FREE: Free T4: 1.4 ng/dL (ref 0.8–1.8)

## 2024-09-23 MED ORDER — LEVOTHYROXINE SODIUM 112 MCG PO TABS: 112.0000 ug | ORAL_TABLET | Freq: Every day | ORAL | 3 refills | Status: AC

## 2024-09-23 NOTE — Addendum Note (Signed)
 Addended by: TRIXIE FILE on: 09/23/2024 10:18 AM   Modules accepted: Orders

## 2025-09-22 ENCOUNTER — Ambulatory Visit: Admitting: Internal Medicine
